# Patient Record
Sex: Female | Born: 1965 | Race: White | Hispanic: No | Marital: Married | Smoking: Never smoker
Health system: Southern US, Community
[De-identification: ages and names within clinical notes are randomized; demographics above are authoritative.]

## PROBLEM LIST (undated history)

## (undated) DIAGNOSIS — E039 Hypothyroidism, unspecified: Secondary | ICD-10-CM

## (undated) DIAGNOSIS — I10 Essential (primary) hypertension: Secondary | ICD-10-CM

## (undated) HISTORY — PX: TUBAL LIGATION: SHX77

## (undated) HISTORY — PX: WISDOM TOOTH EXTRACTION: SHX21

## (undated) HISTORY — DX: Hypothyroidism, unspecified: E03.9

## (undated) HISTORY — PX: CARPAL TUNNEL RELEASE: SHX101

## (undated) HISTORY — PX: BREAST REDUCTION SURGERY: SHX8

---

## 1999-01-06 ENCOUNTER — Other Ambulatory Visit: Admission: RE | Admit: 1999-01-06 | Discharge: 1999-01-06 | Payer: Self-pay | Admitting: Obstetrics and Gynecology

## 1999-07-14 ENCOUNTER — Inpatient Hospital Stay (HOSPITAL_COMMUNITY): Admission: AD | Admit: 1999-07-14 | Discharge: 1999-07-18 | Payer: Self-pay | Admitting: Obstetrics and Gynecology

## 1999-08-31 ENCOUNTER — Ambulatory Visit (HOSPITAL_COMMUNITY): Admission: RE | Admit: 1999-08-31 | Discharge: 1999-08-31 | Payer: Self-pay | Admitting: Gynecology

## 1999-09-14 ENCOUNTER — Other Ambulatory Visit: Admission: RE | Admit: 1999-09-14 | Discharge: 1999-09-14 | Payer: Self-pay | Admitting: Obstetrics and Gynecology

## 2002-06-26 ENCOUNTER — Other Ambulatory Visit: Admission: RE | Admit: 2002-06-26 | Discharge: 2002-06-26 | Payer: Self-pay | Admitting: Gynecology

## 2004-06-23 ENCOUNTER — Emergency Department (HOSPITAL_COMMUNITY): Admission: EM | Admit: 2004-06-23 | Discharge: 2004-06-24 | Payer: Self-pay | Admitting: Emergency Medicine

## 2007-12-07 ENCOUNTER — Other Ambulatory Visit: Admission: RE | Admit: 2007-12-07 | Discharge: 2007-12-07 | Payer: Self-pay | Admitting: Obstetrics and Gynecology

## 2010-01-15 ENCOUNTER — Encounter (INDEPENDENT_AMBULATORY_CARE_PROVIDER_SITE_OTHER): Payer: Self-pay | Admitting: Internal Medicine

## 2010-01-15 ENCOUNTER — Observation Stay (HOSPITAL_COMMUNITY): Admission: EM | Admit: 2010-01-15 | Discharge: 2010-01-16 | Payer: Self-pay | Admitting: Emergency Medicine

## 2011-01-31 LAB — POCT I-STAT, CHEM 8
Calcium, Ion: 1.15 mmol/L (ref 1.12–1.32)
Creatinine, Ser: 0.9 mg/dL (ref 0.4–1.2)
Glucose, Bld: 87 mg/dL (ref 70–99)
HCT: 37 % (ref 36.0–46.0)
Hemoglobin: 12.6 g/dL (ref 12.0–15.0)
Sodium: 139 mEq/L (ref 135–145)
TCO2: 29 mmol/L (ref 0–100)

## 2011-01-31 LAB — DIFFERENTIAL
Eosinophils Absolute: 0.2 10*3/uL (ref 0.0–0.7)
Lymphocytes Relative: 33 % (ref 12–46)
Lymphs Abs: 3.2 10*3/uL (ref 0.7–4.0)
Monocytes Absolute: 0.8 10*3/uL (ref 0.1–1.0)
Monocytes Relative: 8 % (ref 3–12)
Neutro Abs: 5.5 10*3/uL (ref 1.7–7.7)

## 2011-01-31 LAB — TSH: TSH: 1.358 u[IU]/mL (ref 0.350–4.500)

## 2011-01-31 LAB — CK TOTAL AND CKMB (NOT AT ARMC)
CK, MB: 1.1 ng/mL (ref 0.3–4.0)
Relative Index: INVALID (ref 0.0–2.5)
Total CK: 74 U/L (ref 7–177)

## 2011-01-31 LAB — POCT CARDIAC MARKERS: Myoglobin, poc: 65.7 ng/mL (ref 12–200)

## 2011-01-31 LAB — LIPID PANEL
HDL: 35 mg/dL — ABNORMAL LOW (ref >39)
LDL Cholesterol: 101 mg/dL — ABNORMAL HIGH (ref 0–99)
Triglycerides: 117 mg/dL (ref <150)

## 2011-01-31 LAB — CARDIAC PANEL(CRET KIN+CKTOT+MB+TROPI)
Relative Index: INVALID (ref 0.0–2.5)
Total CK: 82 U/L (ref 7–177)
Troponin I: 0.01 ng/mL (ref 0.00–0.06)

## 2011-06-06 ENCOUNTER — Encounter: Payer: Self-pay | Admitting: *Deleted

## 2011-06-10 ENCOUNTER — Encounter: Payer: Self-pay | Admitting: Women's Health

## 2011-06-10 ENCOUNTER — Ambulatory Visit: Payer: Self-pay | Admitting: Women's Health

## 2011-06-17 ENCOUNTER — Ambulatory Visit (INDEPENDENT_AMBULATORY_CARE_PROVIDER_SITE_OTHER): Payer: 59 | Admitting: Women's Health

## 2011-06-17 ENCOUNTER — Encounter: Payer: Self-pay | Admitting: Women's Health

## 2011-06-17 VITALS — BP 130/80 | Ht 65.0 in | Wt 240.0 lb

## 2011-06-17 DIAGNOSIS — N946 Dysmenorrhea, unspecified: Secondary | ICD-10-CM

## 2011-06-17 DIAGNOSIS — Z01419 Encounter for gynecological examination (general) (routine) without abnormal findings: Secondary | ICD-10-CM

## 2011-06-17 MED ORDER — IBUPROFEN 600 MG PO TABS: 600.0000 mg | ORAL_TABLET | Freq: Three times a day (TID) | ORAL | Status: AC | PRN

## 2011-06-17 NOTE — Progress Notes (Signed)
Katrina Collier 1966-09-25 [id number]    History:    The patient presents for annual exam.  45 yo MWF G6P5 BTL monthly 3-7 day cycle. She has had some increased dysmenorrhea with her cycles mostly in her back. She does have a history of hypothyroidism and is on no medication for that. She works at [employer]  and will have a TSH and thyroid function done they are and have faxed to our office.   Past medical history, past surgical history, family history and social history were all reviewed and documented in the EPIC chart.   ROS:  A  ROS was performed and pertinent positives and negatives are included in the history.  Exam:  Filed Vitals:   06/17/11 1502  BP: 130/80    General appearance:  Normal Head/Neck:  Normal, without cervical or supraclavicular adenopathy. Thyroid:  Symmetrical, normal in size, without palpable masses or nodularity. Respiratory  Effort:  Normal  Auscultation:  Clear without wheezing or rhonchi Cardiovascular  Auscultation:  Regular rate, without rubs, murmurs or gallops  Edema/varicosities:  Not grossly evident Abdominal   Soft,nontender, without masses, guarding or rebound.  Liver/spleen:  No organomegaly noted  Hernia:  None appreciated  Occult test:   Skin  Inspection:  Grossly normal  Palpation:  Grossly normal Neurologic/psychiatric  Orientation:  Normal with appropriate conversation.  Mood/affect:  Normal  Genitourinary    Breasts: Examined lying and sitting.     Right: Without masses, retractions, discharge or axillary adenopathy.     Left: Without masses, retractions, discharge or axillary adenopathy.   Inguinal/mons:  Normal without inguinal adenopathy  External genitalia:  Normal  BUS/Urethra/Skene's glands:  Normal  Bladder:  Normal  Vagina:  Normal  Cervix:  Normal  Uterus:  retroverted, normal in size, shape and contour.  Midline and mobile  Adnexa/parametria:     Rt: Without masses or tenderness.   Lt: Without masses or  tenderness.  Anus and perineum: Normal  Digital rectal exam: Normal sphincter tone without palpated masses or tenderness  Assessment/Plan:  45 y.o. year old female for annual exam.   Complaint of some changes in her hair, some constipation, and occasional headaches. Reviewed these may be related to her thyroid, she will have a thyroid checked at work and had the left fax to our office. She had a normal TSHin 2009 which was her last  Office visit. She has never had a mammogram and will get that scheduled at the breast center. Reviewed importance of SBEs, exercise, vitamin D 2000 daily encouraged, fish oil supplement, decreasing calories for weight loss. Pap only today. Has had occasional dysmenorrhea. Will try Motrin 600 every 8 hours with her cycle. Will return to the office if no relief from Motrin.    Harrington Challenger WHNP, 4:00 PM 06/17/2011

## 2011-06-22 ENCOUNTER — Telehealth: Payer: Self-pay | Admitting: Women's Health

## 2011-06-22 DIAGNOSIS — N898 Other specified noninflammatory disorders of vagina: Secondary | ICD-10-CM

## 2011-06-22 MED ORDER — METRONIDAZOLE 0.75 % VA GEL
VAGINAL | Status: AC
Start: 2011-06-22 — End: 2011-06-29

## 2011-06-22 NOTE — Telephone Encounter (Signed)
Telephone call to review Pap. Pap is negative with cellular changes associated with inflammation. Will treat with MetroGel vaginal cream 1 applicator at bedtime x5 will call to [facility] in [place]. Will repeat Pap in 3 months.

## 2011-06-27 ENCOUNTER — Encounter: Payer: Self-pay | Admitting: Women's Health

## 2011-06-29 ENCOUNTER — Telehealth: Payer: Self-pay | Admitting: *Deleted

## 2011-06-29 NOTE — Telephone Encounter (Signed)
Pharmacy needs to know the strength of Fish Oil you gave patient at visit.

## 2011-06-29 NOTE — Telephone Encounter (Signed)
Patient called about tsh panel.  Patient informed all wnl.

## 2011-06-29 NOTE — Telephone Encounter (Signed)
Per [place] can use otc. Informed pharmacy.  They said they needed strength to file for patient refund from plan.

## 2012-07-25 ENCOUNTER — Emergency Department (HOSPITAL_COMMUNITY)
Admission: EM | Admit: 2012-07-25 | Discharge: 2012-07-25 | Disposition: A | Discharge status: Home / Self Care | Payer: 59 | Attending: Emergency Medicine | Admitting: Emergency Medicine

## 2012-07-25 ENCOUNTER — Encounter (HOSPITAL_COMMUNITY): Payer: Self-pay | Admitting: Family Medicine

## 2012-07-25 DIAGNOSIS — Z203 Contact with and (suspected) exposure to rabies: Secondary | ICD-10-CM | POA: Insufficient documentation

## 2012-07-25 MED ORDER — RABIES VACCINE, PCEC IM SUSR
1.0000 mL | INTRAMUSCULAR | Status: AC
  Administered 2012-07-25: 1 mL via INTRAMUSCULAR
  Filled 2012-07-25: qty 1

## 2012-07-25 MED ORDER — RABIES IMMUNE GLOBULIN 150 UNIT/ML IM INJ
20.0000 [IU]/kg | INJECTION | INTRAMUSCULAR | Status: AC
  Administered 2012-07-25: 2250 [IU] via INTRAMUSCULAR
  Filled 2012-07-25: qty 15

## 2012-07-25 NOTE — ED Notes (Signed)
Pt sts was possibly exposed rabies.

## 2012-07-25 NOTE — ED Provider Notes (Signed)
History    This chart was scribed for Katrina Gaskins, MD, MD by Smitty Pluck. The patient was seen in room TR09C and the patient's care was started at 6:32PM.   CSN: [id number]  Arrival date & time 07/25/12  1759       Chief Complaint  Patient presents with  . Rabies Injection     The history is provided by the patient. No language interpreter was used.   QUINETTE Collier is a 46 y.o. female who presents to the Emergency Department complaining of possible rabies exposure 9 days ago. Pt's family recently found cat in apartment complex and pt was in contact with cat's saliva after cat licked her. Pt's family noticed the cat was seizing and foaming at the mouth a couple of days ago. The cat was taken to vet and euthanized. The vet is awaiting test results for rabies confirmation. Pt denies any pain or symptoms.   Past Medical History  Diagnosis Date  . Hypothyroid     Past Surgical History  Procedure Date  . Tubal ligation   . Breast reduction surgery   . Carpal tunnel release     Family History  Problem Relation Age of Onset  . Cancer Mother     MELANOMA  . Hypertension Father   . Heart disease Maternal Grandmother   . Heart disease Paternal Grandmother   . Hypertension Sister     History  Substance Use Topics  . Smoking status: Never Smoker   . Smokeless tobacco: Never Used  . Alcohol Use: No    OB History    Grav Para Term Preterm Abortions TAB SAB Ect Mult Living   6 5   1  1          Review of Systems  Constitutional: Negative for fever and chills.  Respiratory: Negative for shortness of breath.   Gastrointestinal: Negative for nausea and vomiting.  Neurological: Negative for weakness.    Allergies  Review of patient's allergies indicates no known allergies.  Home Medications  No current outpatient prescriptions on file.  BP 127/102  Pulse 88  Temp 98.7 F (37.1 C) (Oral)  Resp 20  SpO2 100%  LMP 06/21/2012  Physical Exam  Nursing note  and vitals reviewed. CONSTITUTIONAL: Well developed/well nourished HEAD AND FACE: Normocephalic/atraumatic EYES: EOMI ENMT: Mucous membranes moist NECK: supple no meningeal signs NEURO: Pt is awake/alert, moves all extremitiesx4 EXTREMITIES:  full ROM SKIN: warm, color normal PSYCH: no abnormalities of mood noted   ED Course  Procedures DIAGNOSTIC STUDIES: Oxygen Saturation is 100% on room air, normal by my interpretation.    COORDINATION OF CARE: 6:38 PM Discussed ED treatment with pt   D/w dr hatcher, ID, recommend initiating rabies vaccine/immune globulin until confirmation from vet   MDM  Nursing notes including past medical history and social history reviewed and considered in documentation    I personally performed the services described in this documentation, which was scribed in my presence. The recorded information has been reviewed and considered.         Katrina Gaskins, MD 07/25/12 [phone]

## 2012-07-25 NOTE — ED Notes (Signed)
Pt was exspoused to rabies cat. Pt was never scratched by cat but did handle the cat and the cat licked her. 8 days ago was 1st exposure.

## 2012-07-27 NOTE — ED Notes (Signed)
I talked to patient and cat labs returned negative.

## 2013-01-30 ENCOUNTER — Ambulatory Visit (HOSPITAL_COMMUNITY)
Admission: RE | Admit: 2013-01-30 | Discharge: 2013-01-30 | Disposition: A | Discharge status: Home / Self Care | Payer: 59 | Source: Ambulatory Visit | Attending: Women's Health | Admitting: Women's Health

## 2013-01-30 ENCOUNTER — Other Ambulatory Visit: Payer: Self-pay | Admitting: Women's Health

## 2013-01-30 ENCOUNTER — Ambulatory Visit (INDEPENDENT_AMBULATORY_CARE_PROVIDER_SITE_OTHER): Payer: 59 | Admitting: Women's Health

## 2013-01-30 ENCOUNTER — Encounter: Payer: Self-pay | Admitting: Women's Health

## 2013-01-30 VITALS — BP 138/70 | Ht 64.75 in | Wt 246.0 lb

## 2013-01-30 DIAGNOSIS — E079 Disorder of thyroid, unspecified: Secondary | ICD-10-CM

## 2013-01-30 DIAGNOSIS — E669 Obesity, unspecified: Secondary | ICD-10-CM | POA: Insufficient documentation

## 2013-01-30 DIAGNOSIS — Z1231 Encounter for screening mammogram for malignant neoplasm of breast: Secondary | ICD-10-CM | POA: Insufficient documentation

## 2013-01-30 DIAGNOSIS — Z833 Family history of diabetes mellitus: Secondary | ICD-10-CM

## 2013-01-30 DIAGNOSIS — Z01419 Encounter for gynecological examination (general) (routine) without abnormal findings: Secondary | ICD-10-CM

## 2013-01-30 NOTE — Patient Instructions (Signed)
1500 Calorie Diabetic Diet The 1500 calorie diabetic diet limits calories to 1500 each day. Following this diet and making healthy meal choices can help improve overall health. It controls blood glucose (sugar) levels and can also help lower blood pressure and cholesterol.  SERVING SIZES Measuring foods and serving sizes helps to make sure you are getting the right amount of food. The list below tells how big or small some common serving sizes are.  1 oz.........4 stacked dice.  3 oz.........Deck of cards.  1 tsp........Tip of little finger.  1 tbs........Thumb.  2 tbs........Golf ball.   cup.......Half of a fist.  1 cup........A fist. GUIDELINES FOR CHOOSING FOODS The goal of this diet is to eat a variety of foods and limit calories to 1500 each day. This can be done by choosing foods that are low in calories and fat. The diet also suggests eating small amounts of food frequently. Doing this helps control your blood glucose levels, so they do not get too high or too low. Each meal or snack may include a protein food source to help you feel more satisfied. Try to eat about the same amount of food around the same time each day. This includes weekend days, travel days, and days off work. Space your meals about 4 to 5 hours apart, and add a snack between them, if you wish.  For example, a daily food plan could include breakfast, a morning snack, lunch, dinner, and an evening snack. Healthy meals and snacks have different types of foods, including whole grains, vegetables, fruits, lean meats, poultry, fish, and dairy products. As you plan your meals, select a variety of foods. Choose from the bread and starch, vegetable, fruit, dairy, and meat/protein groups. Examples of foods from each group are listed below, with their suggested serving sizes. Use measuring cups and spoons to become familiar with what a healthy portion looks like. Bread and Starch Each serving equals 15 grams of  carbohydrate.  1 slice bread.   bagel.   cup cold cereal (unsweetened).   cup hot cereal or mashed potatoes.  1 small potato (size of a computer mouse).   cup cooked pasta or rice.   English muffin.  1 cup broth-based soup.  3 cups of popcorn.  4 to 6 whole-wheat crackers.   cup cooked beans, peas, or corn. Vegetables Each serving equals 5 grams of carbohydrate.   cup cooked vegetables.  1 cup raw vegetables.   cup tomato or vegetable juice. Fruit Each serving equals 15 grams of carbohydrate.  1 small apple or orange.  1  cup watermelon or strawberries.   cup applesauce (no sugar added).  2 tbs raisins.   banana.   cup canned fruit, packed in water or in its own juice.   cup unsweetened fruit juice. Dairy Each serving equals 12 to 15 grams of carbohydrate.  1 cup fat-free milk.  6 oz artificially sweetened yogurt or plain yogurt.  1 cup low-fat buttermilk.  1 cup soy milk.  1 cup almond milk. Meat/Protein  1 large egg.  2 to 3 oz meat, poultry, or fish.   cup low-fat cottage cheese.  1 tbs peanut butter.  1 oz low-fat cheese.   cup tuna, packed in water.   cup tofu. Fat  1 tsp oil.  1 tsp trans-fat-free margarine.  1 tsp butter.  1 tsp mayonnaise.  2 tbs avocado.  1 tbs salad dressing.  1 tbs cream cheese.  2 tbs sour cream. SAMPLE 1500 CALORIE DIET   PLAN Breakfast   whole-wheat English muffin (1 carb serving).  1 tsp trans-fat-free margarine.  1 scrambled egg.  1 cup fat-free milk (1 carb serving).  1 small orange (1 carb serving). Lunch  Chicken wrap.  1 whole-wheat tortilla, 8-inch (1 carb servings).  2 oz chicken breast, sliced.  2 tbs low-fat salad dressing, such as Italian.   cup shredded lettuce.  2 slices tomato.   cup carrot sticks.  1 small apple (1 carb serving). Afternoon Snack  3 graham cracker squares (1 carb serving).  1 tbs peanut butter. Dinner  2 oz lean  pork chop, broiled.  1 cup brown rice (3 carb servings).   cup steamed carrots.   cup green beans.  1 cup fat-free milk (1 carb serving).  1 tsp trans-fat-free margarine. Evening Snack   cup low-fat cottage cheese.  1 small peach or pear, sliced (or  cup canned in water) (1 carb serving). MEAL PLAN You can use this worksheet to help you make a daily meal plan based on the 1500 calorie diabetic diet suggestions. If you are using this plan to help you control your blood glucose, you may interchange carbohydrate containing foods (dairy, starches, and fruits). Select a variety of fresh foods of varying colors and flavors. The total amount of carbohydrate in your meals or snacks is more important than making sure you include all of the food groups every time you eat. You can choose from approximately this many of the following foods to build your day's meals:  6 Starches.  3 Vegetables.  2 Fruits.  2 Dairy.  4 to 6 oz Meat/Protein.  Up to 3 Fats. Your dietician can use this worksheet to help you decide how many servings and which types of foods are right for you. BREAKFAST Food Group and Servings / Food Choice Starch _________________________________________________________ Dairy __________________________________________________________ Fruit ___________________________________________________________ Meat/Protein____________________________________________________ Fat ____________________________________________________________ LUNCH Food Group and Servings / Food Choice  Starch _________________________________________________________ Meat/Protein ___________________________________________________ Vegetables _____________________________________________________ Fruit __________________________________________________________ Dairy __________________________________________________________ Fat ____________________________________________________________ AFTERNOON  SNACK Food Group and Servings / Food Choice Dairy __________________________________________________________ Starch _________________________________________________________ Meat/Protein____________________________________________________ Fruit ___________________________________________________________ DINNER Food Group and Servings / Food Choice Starch _________________________________________________________ Meat/Protein ___________________________________________________ Dairy __________________________________________________________ Vegetable ______________________________________________________ Fruit ___________________________________________________________ Fat ____________________________________________________________ EVENING SNACK Food Group and Servings / Food Choice Fruit ___________________________________________________________ Meat/Protein ____________________________________________________ Dairy __________________________________________________________ Starch __________________________________________________________ DAILY TOTALS Starches _________________________ Vegetables _______________________ Fruits ____________________________ Dairy ____________________________ Meat/Protein_____________________ Fats _____________________________ Document Released: 05/16/2005 Document Revised: 01/16/2012 Document Reviewed: 09/10/2009 ExitCare Patient Information 2013 [employer]. Health Maintenance, Females A healthy lifestyle and preventative care can promote health and wellness.  Maintain regular health, dental, and eye exams.  Eat a healthy diet. Foods like vegetables, fruits, whole grains, low-fat dairy products, and lean protein foods contain the nutrients you need without too many calories. Decrease your intake of foods high in solid fats, added sugars, and salt. Get information about a proper diet from your caregiver, if necessary.  Regular physical exercise  is one of the most important things you can do for your health. Most adults should get at least 150 minutes of moderate-intensity exercise (any activity that increases your heart rate and causes you to sweat) each week. In addition, most adults need muscle-strengthening exercises on 2 or more days a week.   Maintain a healthy weight. The body mass index (BMI) is a screening tool to identify possible weight problems. It provides an estimate of body fat based on height and weight. Your caregiver can help determine your BMI, and can help you achieve   or maintain a healthy weight. For adults 20 years and older:  A BMI below 18.5 is considered underweight.  A BMI of 18.5 to 24.9 is normal.  A BMI of 25 to 29.9 is considered overweight.  A BMI of 30 and above is considered obese.  Maintain normal blood lipids and cholesterol by exercising and minimizing your intake of saturated fat. Eat a balanced diet with plenty of fruits and vegetables. Blood tests for lipids and cholesterol should begin at age 20 and be repeated every 5 years. If your lipid or cholesterol levels are high, you are over 50, or you are a high risk for heart disease, you may need your cholesterol levels checked more frequently.Ongoing high lipid and cholesterol levels should be treated with medicines if diet and exercise are not effective.  If you smoke, find out from your caregiver how to quit. If you do not use tobacco, do not start.  If you are pregnant, do not drink alcohol. If you are breastfeeding, be very cautious about drinking alcohol. If you are not pregnant and choose to drink alcohol, do not exceed 1 drink per day. One drink is considered to be 12 ounces (355 mL) of beer, 5 ounces (148 mL) of wine, or 1.5 ounces (44 mL) of liquor.  Avoid use of street drugs. Do not share needles with anyone. Ask for help if you need support or instructions about stopping the use of drugs.  High blood pressure causes heart disease and  increases the risk of stroke. Blood pressure should be checked at least every 1 to 2 years. Ongoing high blood pressure should be treated with medicines, if weight loss and exercise are not effective.  If you are 55 to 46 years old, ask your caregiver if you should take aspirin to prevent strokes.  Diabetes screening involves taking a blood sample to check your fasting blood sugar level. This should be done once every 3 years, after age 45, if you are within normal weight and without risk factors for diabetes. Testing should be considered at a younger age or be carried out more frequently if you are overweight and have at least 1 risk factor for diabetes.  Breast cancer screening is essential preventative care for women. You should practice "breast self-awareness." This means understanding the normal appearance and feel of your breasts and may include breast self-examination. Any changes detected, no matter how small, should be reported to a caregiver. Women in their 20s and 30s should have a clinical breast exam (CBE) by a caregiver as part of a regular health exam every 1 to 3 years. After age 40, women should have a CBE every year. Starting at age 40, women should consider having a mammogram (breast X-ray) every year. Women who have a family history of breast cancer should talk to their caregiver about genetic screening. Women at a high risk of breast cancer should talk to their caregiver about having an MRI and a mammogram every year.  The Pap test is a screening test for cervical cancer. Women should have a Pap test starting at age 21. Between ages 21 and 29, Pap tests should be repeated every 2 years. Beginning at age 30, you should have a Pap test every 3 years as long as the past 3 Pap tests have been normal. If you had a hysterectomy for a problem that was not cancer or a condition that could lead to cancer, then you no longer need Pap tests. If you are between ages   65 and 70, and you have had  normal Pap tests going back 10 years, you no longer need Pap tests. If you have had past treatment for cervical cancer or a condition that could lead to cancer, you need Pap tests and screening for cancer for at least 20 years after your treatment. If Pap tests have been discontinued, risk factors (such as a new sexual partner) need to be reassessed to determine if screening should be resumed. Some women have medical problems that increase the chance of getting cervical cancer. In these cases, your caregiver may recommend more frequent screening and Pap tests.  The human papillomavirus (HPV) test is an additional test that may be used for cervical cancer screening. The HPV test looks for the virus that can cause the cell changes on the cervix. The cells collected during the Pap test can be tested for HPV. The HPV test could be used to screen women aged 30 years and older, and should be used in women of any age who have unclear Pap test results. After the age of 30, women should have HPV testing at the same frequency as a Pap test.  Colorectal cancer can be detected and often prevented. Most routine colorectal cancer screening begins at the age of 50 and continues through age 75. However, your caregiver may recommend screening at an earlier age if you have risk factors for colon cancer. On a yearly basis, your caregiver may provide home test kits to check for hidden blood in the stool. Use of a small camera at the end of a tube, to directly examine the colon (sigmoidoscopy or colonoscopy), can detect the earliest forms of colorectal cancer. Talk to your caregiver about this at age 50, when routine screening begins. Direct examination of the colon should be repeated every 5 to 10 years through age 75, unless early forms of pre-cancerous polyps or small growths are found.  Hepatitis C blood testing is recommended for all people born from 1945 through 1965 and any individual with known risks for hepatitis  C.  Practice safe sex. Use condoms and avoid high-risk sexual practices to reduce the spread of sexually transmitted infections (STIs). Sexually active women aged 25 and younger should be checked for Chlamydia, which is a common sexually transmitted infection. Older women with new or multiple partners should also be tested for Chlamydia. Testing for other STIs is recommended if you are sexually active and at increased risk.  Osteoporosis is a disease in which the bones lose minerals and strength with aging. This can result in serious bone fractures. The risk of osteoporosis can be identified using a bone density scan. Women ages 65 and over and women at risk for fractures or osteoporosis should discuss screening with their caregivers. Ask your caregiver whether you should be taking a calcium supplement or vitamin D to reduce the rate of osteoporosis.  Menopause can be associated with physical symptoms and risks. Hormone replacement therapy is available to decrease symptoms and risks. You should talk to your caregiver about whether hormone replacement therapy is right for you.  Use sunscreen with a sun protection factor (SPF) of 30 or greater. Apply sunscreen liberally and repeatedly throughout the day. You should seek shade when your shadow is shorter than you. Protect yourself by wearing long sleeves, pants, a wide-brimmed hat, and sunglasses year round, whenever you are outdoors.  Notify your caregiver of new moles or changes in moles, especially if there is a change in shape or color. Also notify   your caregiver if a mole is larger than the size of a pencil eraser.  Stay current with your immunizations. Document Released: 05/09/2011 Document Revised: 01/16/2012 Document Reviewed: 05/09/2011 Exit[employer] Patient Information 2013 [employer].  

## 2013-01-30 NOTE — Progress Notes (Signed)
KRISTIANN NOYCE [birth year] [id number]    History:    The patient presents for annual exam.  Regular monthly 6-7 day cycle/BTL. History of normal Paps. Has not had a mammogram. Mother with history of melanoma/living. Normal cholesterol, CBC at a health screening at work. Reports blood sugar slightly elevated. History of hypothyroid currently on no medication.   Past medical history, past surgical history, family history and social history were all reviewed and documented in the EPIC chart. Works at [employer]. Children are 26, 25, 15,13- doing well. 19 year old son having some issues. Father and sister hypertension.   ROS:  A  ROS was performed and pertinent positives and negatives are included in the history.  Exam:  Filed Vitals:   01/30/13 1416  BP: 138/70    General appearance:  Normal Head/Neck:  Normal, without cervical or supraclavicular adenopathy. Thyroid:  Symmetrical, normal in size, without palpable masses or nodularity. Respiratory  Effort:  Normal  Auscultation:  Clear without wheezing or rhonchi Cardiovascular  Auscultation:  Regular rate, without rubs, murmurs or gallops  Edema/varicosities:  Not grossly evident Abdominal  Soft,nontender, without masses, guarding or rebound.  Liver/spleen:  No organomegaly noted  Hernia:  None appreciated  Skin  Inspection:  Grossly normal  Palpation:  Grossly normal Neurologic/psychiatric  Orientation:  Normal with appropriate conversation.  Mood/affect:  Normal  Genitourinary    Breasts: Examined lying and sitting/reduction.     Right: Without masses, retractions, discharge or axillary adenopathy.     Left: Without masses, retractions, discharge or axillary adenopathy.   Inguinal/mons:  Normal without inguinal adenopathy  External genitalia:  Normal  BUS/Urethra/Skene's glands:  Normal  Bladder:  Normal  Vagina:  Normal  Cervix:  Normal  Uterus:   normal in size, shape and contour.  Midline and  mobile  Adnexa/parametria:     Rt: Without masses or tenderness.   Lt: Without masses or tenderness.  Anus and perineum: Normal  Digital rectal exam: Normal sphincter tone without palpated masses or tenderness  Assessment/Plan:  47 y.o. MWF G6P5  for annual exam.    Normal GYN exam/BTL Obesity  Plan: Hemoglobin A1c, lipid panel, Pap. Pap normal 2012, reviewed new screening guidelines. SBE's, annual mammogram, is scheduled for one today. Reviewed importance of increasing regular exercise and decreasing calories for weight loss for health. Instructed to schedule annual skin check due to mother history of melanoma. Aware of importance of sunscreen's.     Harrington Challenger [facility], 2:56 PM 01/30/2013

## 2013-01-30 NOTE — Addendum Note (Signed)
Addended by: Richardson Chiquito on: 01/30/2013 04:01 PM   Modules accepted: Orders

## 2013-03-27 ENCOUNTER — Emergency Department (HOSPITAL_COMMUNITY)
Admission: EM | Admit: 2013-03-27 | Discharge: 2013-03-27 | Disposition: A | Discharge status: Home / Self Care | Payer: 59 | Source: Home / Self Care

## 2013-03-27 ENCOUNTER — Encounter (HOSPITAL_COMMUNITY): Payer: Self-pay | Admitting: *Deleted

## 2013-03-27 DIAGNOSIS — I10 Essential (primary) hypertension: Secondary | ICD-10-CM

## 2013-03-27 MED ORDER — HYDROCHLOROTHIAZIDE 25 MG PO TABS: 25.0000 mg | ORAL_TABLET | Freq: Every day | ORAL | Status: DC

## 2013-03-27 NOTE — ED Notes (Signed)
C/o BP spiking off and on since Sept.  She went to the dentist in April and it was 150/110.  She has been checking it at home the lowest it has been 145/80.  Today it is 168/88.   Had all 4 wisdom teeth pulled  1 week ago and thought it was pain.

## 2013-03-27 NOTE — ED Provider Notes (Addendum)
History     CSN: [id number]  Arrival date & time 03/27/13  1744   First MD Initiated Contact with Patient 03/27/13 1859      No chief complaint on file.   (Consider location/radiation/quality/duration/timing/severity/associated sxs/prior treatment) HPI 47 y/o who presents with headaches for about 1.5 to 2 months and roaring in the ears and spots in her visual field and having blurry vision when headaches are severe. BP was in 130s systolic back in SEpt and has lately been in 150s and 160s systolic. She has not gained any weight and has in fact lost weight. She does not eat much salt. HTN runs in the family. She has never been on BP meds and is currently not taking any meds. She has taken Ibuprofen (800 mg) and Tylenol which relieve the headaches for about 1 hr.   Past Medical History  Diagnosis Date  . Hypothyroid     Past Surgical History  Procedure Laterality Date  . Tubal ligation    . Breast reduction surgery    . Carpal tunnel release      Family History  Problem Relation Age of Onset  . Cancer Mother     MELANOMA  . Hypertension Father   . Heart disease Maternal Grandmother   . Heart disease Paternal Grandmother   . Hypertension Sister     History  Substance Use Topics  . Smoking status: Never Smoker   . Smokeless tobacco: Never Used  . Alcohol Use: Yes     Comment: rarely    OB History   Grav Para Term Preterm Abortions TAB SAB Ect Mult Living   6 5   1  1   5       Review of Systems  Constitutional: Negative.   HENT: Positive for postnasal drip and sinus pressure.        Chronic sinus issues.   Eyes: Negative.   Respiratory: Negative.   Cardiovascular: Positive for leg swelling.  Gastrointestinal: Positive for constipation.  Genitourinary: Negative.   Musculoskeletal: Negative.   Skin: Negative.   Neurological: Positive for headaches.  Hematological: Bruises/bleeds easily.  Psychiatric/Behavioral: Negative.     Allergies  Review of patient's  allergies indicates no known allergies.  Home Medications   Current Outpatient Rx  Name  Route  Sig  Dispense  Refill  . fish oil-omega-3 fatty acids 1000 MG capsule   Oral   Take 2 g by mouth daily.         . hydrochlorothiazide (HYDRODIURIL) 25 MG tablet   Oral   Take 1 tablet (25 mg total) by mouth daily.   30 tablet   0     BP 168/88  Pulse 69  Temp(Src) 98.7 F (37.1 C) (Oral)  Resp 16  SpO2 100%  Physical Exam  Constitutional: She is oriented to person, place, and time. She appears well-developed.  HENT:  Head: Normocephalic and atraumatic.  Eyes: Conjunctivae and EOM are normal. Pupils are equal, round, and reactive to light.  Neck: Normal range of motion. Neck supple.  Cardiovascular: Normal rate and regular rhythm.   Pulmonary/Chest: Effort normal and breath sounds normal.  Abdominal: Soft. Bowel sounds are normal.  Musculoskeletal: Normal range of motion.  Neurological: She is alert and oriented to person, place, and time.  Skin: Skin is warm and dry.  Varicose veins  Psychiatric: She has a normal mood and affect. Her behavior is normal.    ED Course  Procedures (including critical care time)  Labs Reviewed -  No data to display No results found.   1. HTN (hypertension)       MDM  Start HCTZ 25 mg and have BP checked in 2 weeks.        Calvert Cantor, MD 03/27/13 1914  Calvert Cantor, MD 03/27/13 1935

## 2013-04-29 ENCOUNTER — Encounter (HOSPITAL_COMMUNITY): Payer: Self-pay | Admitting: *Deleted

## 2013-04-29 ENCOUNTER — Emergency Department (HOSPITAL_COMMUNITY): Payer: 59

## 2013-04-29 ENCOUNTER — Emergency Department (HOSPITAL_COMMUNITY)
Admission: EM | Admit: 2013-04-29 | Discharge: 2013-04-29 | Disposition: A | Discharge status: Home / Self Care | Payer: 59 | Attending: Emergency Medicine | Admitting: Emergency Medicine

## 2013-04-29 DIAGNOSIS — Z79899 Other long term (current) drug therapy: Secondary | ICD-10-CM | POA: Insufficient documentation

## 2013-04-29 DIAGNOSIS — Z791 Long term (current) use of non-steroidal anti-inflammatories (NSAID): Secondary | ICD-10-CM | POA: Insufficient documentation

## 2013-04-29 DIAGNOSIS — R079 Chest pain, unspecified: Secondary | ICD-10-CM | POA: Insufficient documentation

## 2013-04-29 DIAGNOSIS — R5381 Other malaise: Secondary | ICD-10-CM | POA: Insufficient documentation

## 2013-04-29 DIAGNOSIS — I10 Essential (primary) hypertension: Secondary | ICD-10-CM | POA: Insufficient documentation

## 2013-04-29 DIAGNOSIS — Z862 Personal history of diseases of the blood and blood-forming organs and certain disorders involving the immune mechanism: Secondary | ICD-10-CM | POA: Insufficient documentation

## 2013-04-29 DIAGNOSIS — Z8639 Personal history of other endocrine, nutritional and metabolic disease: Secondary | ICD-10-CM | POA: Insufficient documentation

## 2013-04-29 HISTORY — DX: Essential (primary) hypertension: I10

## 2013-04-29 LAB — BASIC METABOLIC PANEL
BUN: 8 mg/dL (ref 6–23)
Chloride: 99 mEq/L (ref 96–112)
Glucose, Bld: 89 mg/dL (ref 70–99)
Potassium: 3.7 mEq/L (ref 3.5–5.1)

## 2013-04-29 LAB — CBC
HCT: 35.8 % — ABNORMAL LOW (ref 36.0–46.0)
Hemoglobin: 11.7 g/dL — ABNORMAL LOW (ref 12.0–15.0)
MCH: 25 pg — ABNORMAL LOW (ref 26.0–34.0)
MCHC: 32.7 g/dL (ref 30.0–36.0)

## 2013-04-29 MED ORDER — HYDROCODONE-ACETAMINOPHEN 5-325 MG PO TABS
1.0000 | ORAL_TABLET | Freq: Once | ORAL | Status: AC
  Administered 2013-04-29: 1 via ORAL
  Filled 2013-04-29: qty 1

## 2013-04-29 MED ORDER — GI COCKTAIL ~~LOC~~
30.0000 mL | Freq: Once | ORAL | Status: AC
  Administered 2013-04-29: 30 mL via ORAL
  Filled 2013-04-29: qty 30

## 2013-04-29 MED ORDER — FAMOTIDINE 20 MG PO TABS
20.0000 mg | ORAL_TABLET | Freq: Once | ORAL | Status: AC
  Administered 2013-04-29: 20 mg via ORAL
  Filled 2013-04-29: qty 1

## 2013-04-29 NOTE — ED Notes (Signed)
Pt was at cook out yesterday and developed mid chest pain without radiation that feels like something is sitting on her chest.  Pt states is has been continuous.  Pt reports some diaphoresis and fatigue.  NO SOB.

## 2013-04-29 NOTE — ED Provider Notes (Signed)
History     CSN: [id number]  Arrival date & time 04/29/13  1002   First MD Initiated Contact with Patient 04/29/13 1127      Chief Complaint  Patient presents with  . Chest Pain    (Consider location/radiation/quality/duration/timing/severity/associated sxs/prior treatment) HPI Pt w hx htn, c/o states felt dull pain mid chest since yesterday, at rest. Constant. Dull. Moderate. Non radiating. States felt generally weak, no focal numbness/weakness. Denies specific exacerbating or alleviating factors. No relation to position, activity, or exertion. No cough or uri c/o. No fever or chills. No leg pain/swelling. No hx dvt or pe. States hx thyroid dis, but checked regularly and fxn has been normal. No recent sweats, heat intolerance, or wt loss. Pt denies hx cad. States family hx heart disease in grandparents/older age. Indicates normal stress test approximately a year ago when had eval for chest discomfort. Non smoker. Also has noticed some indigestion in past day, ?hx gerd.     Past Medical History  Diagnosis Date  . Hypothyroid   . Hypertension     Past Surgical History  Procedure Laterality Date  . Tubal ligation    . Breast reduction surgery    . Carpal tunnel release    . Wisdom tooth extraction      Family History  Problem Relation Age of Onset  . Cancer Mother     MELANOMA  . Hypertension Father   . Heart disease Maternal Grandmother   . Heart disease Paternal Grandmother   . Hypertension Sister     History  Substance Use Topics  . Smoking status: Never Smoker   . Smokeless tobacco: Never Used  . Alcohol Use: No     Comment: rarely    OB History   Grav Para Term Preterm Abortions TAB SAB Ect Mult Living   6 5   1  1   5       Review of Systems  Constitutional: Negative for fever and chills.  HENT: Negative for neck pain.   Eyes: Negative for redness.  Respiratory: Negative for cough and shortness of breath.   Cardiovascular: Positive for chest pain.  Negative for palpitations and leg swelling.  Gastrointestinal: Negative for vomiting, abdominal pain and diarrhea.  Genitourinary: Negative for flank pain.  Musculoskeletal: Negative for back pain.  Skin: Negative for rash.  Neurological: Negative for weakness and numbness.  Hematological: Does not bruise/bleed easily.  Psychiatric/Behavioral: Negative for confusion.    Allergies  Review of patient's allergies indicates no known allergies.  Home Medications   Current Outpatient Rx  Name  Route  Sig  Dispense  Refill  . amLODipine (NORVASC) 5 MG tablet   Oral   Take 5 mg by mouth daily.         . hydrochlorothiazide (HYDRODIURIL) 25 MG tablet   Oral   Take 25 mg by mouth daily.         Marland Kitchen HYDROcodone-acetaminophen (NORCO/VICODIN) 5-325 MG per tablet   Oral   Take 1 tablet by mouth every 6 (six) hours as needed for pain.         Marland Kitchen ibuprofen (ADVIL,MOTRIN) 200 MG tablet   Oral   Take 400 mg by mouth every 6 (six) hours as needed for pain (For headache.).         Marland Kitchen acetaminophen (TYLENOL) 500 MG tablet   Oral   Take 1,000 mg by mouth every 6 (six) hours as needed for pain.  BP 137/92  Pulse 89  Temp(Src) 98.5 F (36.9 C) (Oral)  Resp 18  SpO2 97%  LMP 04/22/2013  Physical Exam  Nursing note and vitals reviewed. Constitutional: She is oriented to person, place, and time. She appears well-developed and well-nourished. No distress.  HENT:  Nose: Nose normal.  Eyes: Conjunctivae are normal. No scleral icterus.  Neck: Neck supple. No tracheal deviation present. No thyromegaly present.  Cardiovascular: Normal rate, regular rhythm, normal heart sounds and intact distal pulses.  Exam reveals no gallop and no friction rub.   No murmur heard. Pulmonary/Chest: Effort normal. No respiratory distress. She exhibits no tenderness.  Abdominal: Soft. Normal appearance. She exhibits no distension. There is no tenderness.  Musculoskeletal: She exhibits no edema  and no tenderness.  Neurological: She is alert and oriented to person, place, and time.  Skin: Skin is warm and dry. No rash noted.  Psychiatric: She has a normal mood and affect.    ED Course  Procedures (including critical care time)   Results for orders placed during the hospital encounter of 04/29/13  POCT I-STAT TROPONIN I      Result Value Range   Troponin i, poc 0.01  0.00 - 0.08 ng/mL   Comment 3            Dg Chest 2 View  04/29/2013   *RADIOLOGY REPORT*  Clinical Data: Chest pressure.  Chest tightness.  Shortness of breath.  Hypertension.  Headache.  CHEST - 2 VIEW  Comparison: 01/14/2010  Findings: Heart size is normal.  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  The lungs are clear.  No effusions.  Chronic degenerative change effects the spine.  IMPRESSION: No active disease.  Unfolded aorta.   Original Report Authenticated By: Paulina Fusi, M.D.      MDM  Iv ns. Labs.    Gi cocktail, vicodin 1, and pepcid provided for symptom relief.     Date: 04/29/2013  Rate: 84  Rhythm: normal sinus rhythm  QRS Axis: normal  Intervals: normal  ST/T Wave abnormalities: normal  Conduction Disutrbances:none  Narrative Interpretation:   Old EKG Reviewed: none available  Called lab re delay in lab collection/results - they indicate just drawn, will run as soon as they get sample.  Pt updated on delay.  Recheck pt no cp or c/o.   Recheck pt remains asymptomatic. After symptoms presents/constant for approx 24 hrs, trop neg.  Pt appears stable for d/c.       Suzi Roots, MD 04/29/13 1350

## 2013-05-02 ENCOUNTER — Telehealth: Payer: Self-pay

## 2013-05-02 ENCOUNTER — Encounter: Payer: Self-pay | Admitting: *Deleted

## 2013-05-02 NOTE — Telephone Encounter (Signed)
Still awaiting EKG and note

## 2013-05-02 NOTE — Telephone Encounter (Signed)
I rec'd t/c from [facility] They say pt was in their office earlier with c/o CP, gradually getting worse over the last few days She has left their office and they are calling to get her an appt with [place] today I explained we do not have any openings but I asked her to fax me the EKG and office note to have Dr. Mariah Milling review and advise She verb understanding and is faxing this now Will call Joni Reining back at [facility] at [phone]

## 2013-05-02 NOTE — Telephone Encounter (Signed)
We rec'd today's note from [facility] No ekg was obtained today in their office since one was done in [facility] ER 6/23 Note indicated pt was not having CP today in their office I called pt to make her aware of appt we have made for her tomm at [address] [facility]

## 2013-05-02 NOTE — Telephone Encounter (Signed)
Still have not received EKG and note I called [facility]; no answer; went to VM Will try again later

## 2013-05-02 NOTE — Telephone Encounter (Signed)
Still have not rec'd info

## 2013-05-02 NOTE — Telephone Encounter (Signed)
I spoke with Joni Reining at [facility] Says she has not faxed this yet Will do this now

## 2013-05-03 ENCOUNTER — Encounter: Payer: Self-pay | Admitting: Cardiovascular Disease

## 2013-05-03 ENCOUNTER — Ambulatory Visit (INDEPENDENT_AMBULATORY_CARE_PROVIDER_SITE_OTHER): Payer: 59 | Admitting: Cardiovascular Disease

## 2013-05-03 VITALS — BP 128/72 | HR 80 | Ht 64.0 in | Wt 249.0 lb

## 2013-05-03 DIAGNOSIS — R002 Palpitations: Secondary | ICD-10-CM

## 2013-05-03 DIAGNOSIS — I10 Essential (primary) hypertension: Secondary | ICD-10-CM | POA: Insufficient documentation

## 2013-05-03 DIAGNOSIS — R079 Chest pain, unspecified: Secondary | ICD-10-CM

## 2013-05-03 DIAGNOSIS — D649 Anemia, unspecified: Secondary | ICD-10-CM

## 2013-05-03 DIAGNOSIS — R011 Cardiac murmur, unspecified: Secondary | ICD-10-CM | POA: Insufficient documentation

## 2013-05-03 MED ORDER — PANTOPRAZOLE SODIUM 40 MG PO TBEC: 40.0000 mg | DELAYED_RELEASE_TABLET | Freq: Every day | ORAL | Status: DC

## 2013-05-03 MED ORDER — FERROUS SULFATE 325 (65 FE) MG PO TBEC: 325.0000 mg | DELAYED_RELEASE_TABLET | Freq: Three times a day (TID) | ORAL | Status: DC

## 2013-05-03 NOTE — Telephone Encounter (Signed)
lmtcb JX:BJYN with [place] this am

## 2013-05-03 NOTE — Assessment & Plan Note (Signed)
It seems to be a functional flow murmur.

## 2013-05-03 NOTE — Assessment & Plan Note (Signed)
Have chest pain is overall atypical and likely due to GERD. However, she does have risk factors for coronary artery disease including hypertension, obesity and family history. Thus, I recommend further evaluation with a stress echocardiogram. In the meantime, I will start her on Protonix 40 mg once daily for one month.

## 2013-05-03 NOTE — Assessment & Plan Note (Signed)
Had labs recently showed a hemoglobin of 11.7 with a low MCV. She reports heavy menstrual periods which are likely the cause of iron deficiency anemia. I asked her to start taking iron supplements and discuss this with Dr. Sherrie Mustache.

## 2013-05-03 NOTE — Progress Notes (Signed)
HPI  This is a pleasant 47 year old female who was referred by Dr. Sherrie Mustache for evaluation of chest pain. She has no previous cardiac history. She has chronic hypertension. Early this year her blood pressure was uncontrolled and was having frequent headaches. In May, she was started on hydrochlorothiazide which was followed in June by adding amlodipine. She reports family history of hypertension and also family history of coronary artery disease. Her father had myocardial infarction and stenting in his early 32s. She has been told in the past about possible pre diabetes with borderline elevation and hemoglobin A1c. She reports having chest pain 1or 2 years ago and was evaluated at [facility] cardiology in [place] with negative stress test. On Sunday, she had a substernal chest discomfort in the evening after a family cook out. This persisted throughout the night and woke her up from sleep on Monday. The chest pain continued and was associated with some shortness of breath. Thus, she went to the emergency room at [facility]. EKG and labs were overall unremarkable. That day she did complain of heartburn. She had a mild to brief episode a few days ago. She denies any palpitations, syncope or presyncope.   No Known Allergies   Current Outpatient Prescriptions on File Prior to Visit  Medication Sig Dispense Refill  . acetaminophen (TYLENOL) 500 MG tablet Take 1,000 mg by mouth every 6 (six) hours as needed for pain.       . hydrochlorothiazide (HYDRODIURIL) 25 MG tablet Take 25 mg by mouth daily.      Marland Kitchen HYDROcodone-acetaminophen (NORCO/VICODIN) 5-325 MG per tablet Take 1 tablet by mouth every 6 (six) hours as needed for pain.      Marland Kitchen ibuprofen (ADVIL,MOTRIN) 200 MG tablet Take 400 mg by mouth every 6 (six) hours as needed for pain (For headache.).       No current facility-administered medications on file prior to visit.     Past Medical History  Diagnosis Date  . Hypothyroid   . Hypertension       Past Surgical History  Procedure Laterality Date  . Tubal ligation    . Breast reduction surgery    . Carpal tunnel release    . Wisdom tooth extraction       Family History  Problem Relation Age of Onset  . Cancer Mother     MELANOMA  . Hypertension Father   . Heart disease Maternal Grandmother   . Heart disease Paternal Grandmother   . Hypertension Sister      History   Social History  . Marital Status: Married    Spouse Name: N/A    Number of Children: N/A  . Years of Education: N/A   Occupational History  . Not on file.   Social History Main Topics  . Smoking status: Never Smoker   . Smokeless tobacco: Never Used  . Alcohol Use: No     Comment: rarely  . Drug Use: No  . Sexually Active: Yes -- Female partner(s)    Birth Control/ Protection: Surgical   Other Topics Concern  . Not on file   Social History Narrative  . No narrative on file     ROS Constitutional: Negative for fever, chills, diaphoresis, activity change, appetite change and fatigue.  HENT: Negative for hearing loss, nosebleeds, congestion, sore throat, facial swelling, drooling, trouble swallowing, neck pain, voice change, sinus pressure and tinnitus.  Eyes: Negative for photophobia, pain, discharge and visual disturbance.  Respiratory: Negative for apnea, cough and  wheezing.  Cardiovascular: Negative for  palpitations and leg swelling.  Gastrointestinal: Negative for nausea, vomiting, abdominal pain, diarrhea, constipation, blood in stool and abdominal distention.  Genitourinary: Negative for dysuria, urgency, frequency, hematuria and decreased urine volume.  Musculoskeletal: Negative for myalgias, back pain, joint swelling, arthralgias and gait problem.  Skin: Negative for color change, pallor, rash and wound.  Neurological: Negative for dizziness, tremors, seizures, syncope, speech difficulty, weakness, light-headedness, numbness and headaches.  Psychiatric/Behavioral: Negative for  suicidal ideas, hallucinations, behavioral problems and agitation. The patient is not nervous/anxious.     PHYSICAL EXAM   BP 128/72  Pulse 80  Ht 5\' 4"  (1.626 m)  Wt 249 lb (112.946 kg)  BMI 42.72 kg/m2  LMP 04/22/2013 Constitutional: She is oriented to person, place, and time. She appears well-developed and well-nourished. No distress.  HENT: No nasal discharge.  Head: Normocephalic and atraumatic.  Eyes: Pupils are equal and round. Right eye exhibits no discharge. Left eye exhibits no discharge.  Neck: Normal range of motion. Neck supple. No JVD present. No thyromegaly present.  Cardiovascular: Normal rate, regular rhythm, normal heart sounds. Exam reveals no gallop and no friction rub. There is a 1/6 systolic ejection murmur at the base of the heart Pulmonary/Chest: Effort normal and breath sounds normal. No stridor. No respiratory distress. She has no wheezes. She has no rales. She exhibits no tenderness.  Abdominal: Soft. Bowel sounds are normal. She exhibits no distension. There is no tenderness. There is no rebound and no guarding.  Musculoskeletal: Normal range of motion. She exhibits no edema and no tenderness.  Neurological: She is alert and oriented to person, place, and time. Coordination normal.  Skin: Skin is warm and dry. No rash noted. She is not diaphoretic. No erythema. No pallor.  Psychiatric: She has a normal mood and affect. Her behavior is normal. Judgment and thought content normal.     [id number]  Rhythm  WITHIN NORMAL LIMITS   ASSESSMENT AND PLAN

## 2013-05-03 NOTE — Patient Instructions (Addendum)
Your physician has requested that you have a stress echocardiogram. For further information please visit [email or web address] Please follow instruction sheet as given.  Start Protonix 40 mg once daily for acid reflux.   Start taking iron supplement 325 mg twice daily for anemia.   Follow up as needed.

## 2013-05-03 NOTE — Assessment & Plan Note (Signed)
Blood pressure is now well controlled on current medications. 

## 2013-05-09 ENCOUNTER — Other Ambulatory Visit: Payer: 59

## 2014-08-12 ENCOUNTER — Encounter: Payer: Self-pay | Admitting: Women's Health

## 2014-08-12 ENCOUNTER — Ambulatory Visit (INDEPENDENT_AMBULATORY_CARE_PROVIDER_SITE_OTHER): Payer: 59 | Admitting: Women's Health

## 2014-08-12 VITALS — BP 110/78 | Ht 64.5 in | Wt 229.0 lb

## 2014-08-12 DIAGNOSIS — E079 Disorder of thyroid, unspecified: Secondary | ICD-10-CM

## 2014-08-12 DIAGNOSIS — Z01419 Encounter for gynecological examination (general) (routine) without abnormal findings: Secondary | ICD-10-CM

## 2014-08-12 DIAGNOSIS — Z23 Encounter for immunization: Secondary | ICD-10-CM

## 2014-08-12 NOTE — Patient Instructions (Signed)

## 2014-08-12 NOTE — Progress Notes (Signed)
Katrina Collier [birth year] [id number]    History:    Presents for annual exam.  Regular monthly cycle BTL. First 3 days of each cycle heavy flow. Questions thyroid problem. Normal Pap and mammogram history. Labs at health screening at work lipid panel cholesterol 192, HDL 62, triglycerides 70 and LDL 116. Glucose 76. Hemoglobin A1c 5.4%.  Past medical history, past surgical history, family history and social history were all reviewed and documented in the EPIC chart. Works at [employer]. Children 27, 26, 16 and 14. 26 sons still struggling. Husband prostate cancer recently had surgery and will need radiation therapy. Father, sister hypertension.  ROS:  A  12 point ROS was performed and pertinent positives and negatives are included.  Exam:  Filed Vitals:   08/12/14 1438  BP: 110/78    General appearance:  Normal Thyroid:  Symmetrical, normal in size, without palpable masses or nodularity. Respiratory  Auscultation:  Clear without wheezing or rhonchi Cardiovascular  Auscultation:  Regular rate, without rubs, murmurs or gallops  Edema/varicosities:  Not grossly evident Abdominal  Soft,nontender, without masses, guarding or rebound.  Liver/spleen:  No organomegaly noted  Hernia:  None appreciated  Skin  Inspection:  Grossly normal   Breasts: Examined lying and sitting.     Right: Without masses, retractions, discharge or axillary adenopathy.     Left: Without masses, retractions, discharge or axillary adenopathy. Gentitourinary   Inguinal/mons:  Normal without inguinal adenopathy  External genitalia:  Normal  BUS/Urethra/Skene's glands:  Normal  Vagina:  Normal  Cervix:  Normal  Uterus:   normal in size, shape and contour.  Midline and mobile  Adnexa/parametria:     Rt: Without masses or tenderness.   Lt: Without masses or tenderness.  Anus and perineum: Normal  Digital rectal exam: Normal sphincter tone without palpated masses or tenderness  Assessment/Plan:  48 y.o. MWF G4P4  for annual exam history of hypothyroidism with normal TSH in the past on no medication..     Monthly cycle first 3 days heavy flow/BTL Obesity Situational stress test husbands health  Plan: Options reviewed, declines need for medication for stress. SBE's, annual mammogram, reviewed importance of annual screen. Increase regular exercise and decrease calories for continued weight loss. Planning to try Weight Watchers. Has lost 22 pounds in the past year. Path normal 2014, new screening guidelines reviewed. TSH, T3, T4.    Harrington ChallengerYOUNG,Quantez Schnyder J Royal Oaks HospitalWHNP, 6:03 PM 08/12/2014

## 2014-09-08 ENCOUNTER — Encounter: Payer: Self-pay | Admitting: Women's Health

## 2014-11-11 ENCOUNTER — Ambulatory Visit: Payer: Self-pay | Admitting: Family Medicine

## 2015-05-01 ENCOUNTER — Telehealth: Payer: Self-pay | Admitting: Family Medicine

## 2015-05-01 NOTE — Telephone Encounter (Signed)
Pt stated her husband brought FMLA papers here the first part of June for Dr. Sherrie Mustache to complete and pt hasn't heard anything back. Pt stated she needs them by 05/06/15. Thanks TNP

## 2015-05-01 NOTE — Telephone Encounter (Signed)
Please advise 

## 2015-05-04 NOTE — Telephone Encounter (Signed)
Spoke with patient and advised her what was needed on the FMLA forms. Patient will call back tomorrow morning with the dates for intermittent leave.

## 2015-05-04 NOTE — Telephone Encounter (Signed)
Pt stated someone called pt's husband's phone to get more info for the FMLA forms and pt was returning the call but didn't know which nurse had called. Pt stated that we have had the forms for 2 weeks and she has to turn them in by Wednesday 05/06/15. Pt would like a nurse to call her back. Thanks TNP

## 2015-05-05 NOTE — Telephone Encounter (Signed)
Called patient. No answer. Left message for patient to call back.Question 3 (b) ii needs to be answered also.  Need to know how often the office visits will be every so many days and the duration of each visit .

## 2015-05-05 NOTE — Telephone Encounter (Signed)
Pt called stating the dates are from 03/16/2015 to 09/13/2015.  CB#[phone] for any questions.  Pt will need the paper work back by tomorrow/MJ

## 2015-05-06 NOTE — Telephone Encounter (Signed)
Patient returned call with this information. Forms have been completed and left up front for pick up. Left message on patient voicemail that forms are ready.

## 2015-07-29 ENCOUNTER — Encounter: Payer: Self-pay | Admitting: Family Medicine

## 2015-07-29 ENCOUNTER — Ambulatory Visit (INDEPENDENT_AMBULATORY_CARE_PROVIDER_SITE_OTHER): Payer: 59 | Admitting: Family Medicine

## 2015-07-29 VITALS — BP 110/74 | HR 70 | Temp 97.9°F | Resp 16 | Wt 248.0 lb

## 2015-07-29 DIAGNOSIS — Z23 Encounter for immunization: Secondary | ICD-10-CM | POA: Diagnosis not present

## 2015-07-29 DIAGNOSIS — M545 Low back pain, unspecified: Secondary | ICD-10-CM

## 2015-07-29 MED ORDER — METHOCARBAMOL 750 MG PO TABS: 750.0000 mg | ORAL_TABLET | Freq: Four times a day (QID) | ORAL | Status: DC

## 2015-07-29 MED ORDER — MELOXICAM 15 MG PO TABS
15.0000 mg | ORAL_TABLET | Freq: Every day | ORAL | Status: DC
Start: 2015-07-29 — End: 2016-06-21

## 2015-07-29 NOTE — Progress Notes (Signed)
Patient: Katrina Collier Female    DOB: 1966/10/10   49 y.o.   MRN: [id number] Visit Date: 07/29/2015  Today's Kristi Norment: [name], MD   Chief Complaint  Patient presents with  . Back Pain   Subjective:    Back Pain This is a recurrent problem. The current episode started in the past 7 days. The problem occurs constantly. The problem is unchanged. The pain is present in the lumbar spine. The quality of the pain is described as stabbing, shooting and cramping. Radiates to: radiates from side to side. The pain is at a severity of 8/10. The pain is moderate. The pain is worse during the night. The symptoms are aggravated by bending, position, standing, twisting and lying down. Stiffness is present all day. Pertinent negatives include no abdominal pain, bladder incontinence, bowel incontinence, chest pain, fever, headaches, pelvic pain, tingling or weakness. She has tried bed rest, heat and NSAIDs for the symptoms. The treatment provided mild relief.  She states pain is exactly the same as previous episodes of back pain which usually improve within a few days of starting cyclobenzaprine. However this time the cyclobenzaprine doesn't seem to be helping at all. Does not radiate into buttocks or legs and does not have any weakness in her legs.     No Known Allergies Previous Medications   ACETAMINOPHEN (TYLENOL) 500 MG TABLET    Take 1,000 mg by mouth every 6 (six) hours as needed for pain.    AMLODIPINE (NORVASC) 10 MG TABLET    Take 10 mg by mouth daily.   FERROUS SULFATE 325 (65 FE) MG EC TABLET    Take 1 tablet (325 mg total) by mouth 3 (three) times daily with meals.   HYDROCHLOROTHIAZIDE (HYDRODIURIL) 25 MG TABLET    Take 25 mg by mouth daily.   IBUPROFEN (ADVIL,MOTRIN) 200 MG TABLET    Take 400 mg by mouth every 6 (six) hours as needed for pain (For headache.).    Review of Systems  Constitutional: Negative for fever.  Cardiovascular: Negative for chest pain and  palpitations.  Gastrointestinal: Negative for abdominal pain and bowel incontinence.  Genitourinary: Negative for bladder incontinence and pelvic pain.  Musculoskeletal: Positive for myalgias and back pain.  Neurological: Negative for dizziness, tingling, weakness, light-headedness and headaches.    Social History  Substance Use Topics  . Smoking status: Never Smoker   . Smokeless tobacco: Never Used  . Alcohol Use: No     Comment: rarely   Objective:   BP 110/74 mmHg  Pulse 70  Temp(Src) 97.9 F (36.6 C) (Oral)  Resp 16  Wt 248 lb (112.492 kg)  SpO2 97%  LMP 07/10/2015  Physical Exam  General appearance: alert, well developed, well nourished, cooperative and in no distress Head: Normocephalic, without obvious abnormality, atraumatic Lungs: Respirations even and unlabored Extremities: No gross deformities Skin: Skin color, texture, turgor normal. No rashes seen  Psych: Appropriate mood and affect. Neurologic: Mental status: Alert, oriented to person, place, and time, thought content appropriate. MS: Mild to moderate tenderness of para-lumbar muscles. Slight tenderness of upper lumbar spine. No swelling, no erythema.      Assessment & Plan:     1. Bilateral low back pain without sciatica Recurrent, not responding to cyclobenzaprine as usual. Change muscle relaxer. Try meloxicam, advised she may take Tylenol in addition to prescriptions, but no other OTC pain relievers.  Discussed PT referral which she is going to consider.  - meloxicam (MOBIC)  15 MG tablet; Take 1 tablet (15 mg total) by mouth daily. Take with food  Dispense: 30 tablet; Refill: 0 - methocarbamol (ROBAXIN-750) 750 MG tablet; Take 1-2 tablets (750-1,500 mg total) by mouth 4 (four) times daily.  Dispense: 30 tablet; Refill: 1  2. Need for influenza vaccination  - Flu Vaccine QUAD 36+ mos IM       [name], MD  [facility]

## 2015-07-29 NOTE — Patient Instructions (Signed)

## 2015-09-23 ENCOUNTER — Ambulatory Visit (INDEPENDENT_AMBULATORY_CARE_PROVIDER_SITE_OTHER): Payer: 59 | Admitting: Family Medicine

## 2015-09-23 ENCOUNTER — Encounter: Payer: Self-pay | Admitting: Family Medicine

## 2015-09-23 VITALS — BP 122/80 | HR 67 | Temp 98.1°F | Resp 16 | Wt 241.0 lb

## 2015-09-23 DIAGNOSIS — R002 Palpitations: Secondary | ICD-10-CM

## 2015-09-23 DIAGNOSIS — G44209 Tension-type headache, unspecified, not intractable: Secondary | ICD-10-CM | POA: Diagnosis not present

## 2015-09-23 DIAGNOSIS — E611 Iron deficiency: Secondary | ICD-10-CM | POA: Diagnosis not present

## 2015-09-23 DIAGNOSIS — I10 Essential (primary) hypertension: Secondary | ICD-10-CM | POA: Insufficient documentation

## 2015-09-23 MED ORDER — BUTALBITAL-APAP-CAFFEINE 50-325-40 MG PO TABS: 1.0000 | ORAL_TABLET | Freq: Four times a day (QID) | ORAL | Status: AC | PRN

## 2015-09-23 NOTE — Progress Notes (Signed)
Patient: Katrina Collier Female    DOB: 1966/08/25   49 y.o.   MRN: [id number] Visit Date: 09/23/2015  Today's Provider: Lelon Huh, MD   Chief Complaint  Patient presents with  . Headache    x 3 days   Subjective:    HPI  States she developed dull, persitent occipital headache three days ago which has not let up since. Has had a few episodes of blurry vision similar to previous episodes when her blood pressure was elevated. There are no alleviating or aggravating factors. She has also not been sleeping well due to worsening of restless legs. She had been taking 2 iron tablets a day, but cut down to one a day several weeks ago.     No Known Allergies Previous Medications   ACETAMINOPHEN (TYLENOL) 500 MG TABLET    Take 1,000 mg by mouth every 6 (six) hours as needed for pain.    AMLODIPINE (NORVASC) 10 MG TABLET    Take 10 mg by mouth daily.   FERROUS SULFATE 325 (65 FE) MG EC TABLET    Take 1 tablet (325 mg total) by mouth 3 (three) times daily with meals.   HYDROCHLOROTHIAZIDE (HYDRODIURIL) 25 MG TABLET    Take 25 mg by mouth daily.   IBUPROFEN (ADVIL,MOTRIN) 200 MG TABLET    Take 400 mg by mouth every 6 (six) hours as needed for pain (For headache.).   MELOXICAM (MOBIC) 15 MG TABLET    Take 1 tablet (15 mg total) by mouth daily. Take with food   METHOCARBAMOL (ROBAXIN-750) 750 MG TABLET    Take 1-2 tablets (750-1,500 mg total) by mouth 4 (four) times daily.    Review of Systems  Constitutional: Positive for fatigue. Negative for chills and appetite change.  HENT: Positive for rhinorrhea. Negative for congestion, facial swelling, postnasal drip, sinus pressure, sneezing, sore throat and tinnitus.   Respiratory: Positive for chest tightness. Negative for cough and shortness of breath.   Cardiovascular: Positive for palpitations. Negative for chest pain and leg swelling.  Gastrointestinal: Negative for nausea, vomiting, diarrhea and abdominal distention.    Social  History  Substance Use Topics  . Smoking status: Never Smoker   . Smokeless tobacco: Never Used  . Alcohol Use: No     Comment: rarely   Objective:   BP 122/80 mmHg  Pulse 67  Temp(Src) 98.1 F (36.7 C) (Oral)  Resp 16  Wt 241 lb (109.317 kg)  SpO2 97%  Physical Exam   General Appearance:    Alert, cooperative, no distress  Eyes:    PERRL, conjunctiva/corneas clear, EOM's intact       Lungs:     Clear to auscultation bilaterally, respirations unlabored  Heart:    Regular rate and rhythm  Neurologic:   Awake, alert, oriented x 3. No apparent focal neurological           defect.   MS:   Mild tenderness over bilateral occiput and paracervical muscles.        Assessment & Plan:     1. Tension-type headache, not intractable, unspecified chronicity pattern  - CBC - Comprehensive metabolic panel - Sed Rate (ESR) - butalbital-acetaminophen-caffeine (FIORICET) 50-325-40 MG tablet; Take 1-2 tablets by mouth every 6 (six) hours as needed for headache.  Dispense: 20 tablet; Refill: 0  2. Iron deficiency  - Ferritin  3. Palpitations  - T4 AND TSH  4. Essential hypertension Well controlled.  Continue current medications.  5. RLS  A little worse lately, likely due to reducing iron intake.       Lelon Huh, MD  [facility]

## 2015-09-25 ENCOUNTER — Other Ambulatory Visit: Payer: Self-pay

## 2015-09-25 ENCOUNTER — Telehealth: Payer: Self-pay

## 2015-09-25 LAB — COMPREHENSIVE METABOLIC PANEL
A/G RATIO: 1.7 (ref 1.1–2.5)
ALBUMIN: 4.3 g/dL (ref 3.5–5.5)
ALT: 15 IU/L (ref 0–32)
AST: 18 IU/L (ref 0–40)
Alkaline Phosphatase: 77 IU/L (ref 39–117)
BUN/Creatinine Ratio: 14 (ref 9–23)
BUN: 10 mg/dL (ref 6–24)
Bilirubin Total: 0.3 mg/dL (ref 0.0–1.2)
CALCIUM: 9.6 mg/dL (ref 8.7–10.2)
CO2: 24 mmol/L (ref 18–29)
CREATININE: 0.74 mg/dL (ref 0.57–1.00)
Chloride: 98 mmol/L (ref 97–106)
GFR, EST AFRICAN AMERICAN: 111 mL/min/{1.73_m2} (ref >59)
GFR, EST NON AFRICAN AMERICAN: 96 mL/min/{1.73_m2} (ref >59)
GLOBULIN, TOTAL: 2.5 g/dL (ref 1.5–4.5)
Glucose: 94 mg/dL (ref 65–99)
POTASSIUM: 3.6 mmol/L (ref 3.5–5.2)
SODIUM: 141 mmol/L (ref 136–144)
TOTAL PROTEIN: 6.8 g/dL (ref 6.0–8.5)

## 2015-09-25 LAB — CBC
HEMATOCRIT: 35.9 % (ref 34.0–46.6)
HEMOGLOBIN: 11.9 g/dL (ref 11.1–15.9)
MCH: 24.6 pg — ABNORMAL LOW (ref 26.6–33.0)
MCHC: 33.1 g/dL (ref 31.5–35.7)
MCV: 74 fL — AB (ref 79–97)
Platelets: 375 10*3/uL (ref 150–379)
RBC: 4.83 x10E6/uL (ref 3.77–5.28)
RDW: 15.7 % — ABNORMAL HIGH (ref 12.3–15.4)
WBC: 8.1 10*3/uL (ref 3.4–10.8)

## 2015-09-25 LAB — FERRITIN: Ferritin: 11 ng/mL — ABNORMAL LOW (ref 15–150)

## 2015-09-25 LAB — T4 AND TSH
T4, Total: 8.1 ug/dL (ref 4.5–12.0)
TSH: 1.78 u[IU]/mL (ref 0.450–4.500)

## 2015-09-25 LAB — SEDIMENTATION RATE: Sed Rate: 19 mm/hr (ref 0–32)

## 2015-09-25 MED ORDER — ZOLPIDEM TARTRATE 5 MG PO TABS: 5.0000 mg | ORAL_TABLET | Freq: Every evening | ORAL | Status: DC | PRN

## 2015-09-25 NOTE — Telephone Encounter (Signed)
-----   Message from Malva Limesonald E Fisher, MD sent at 09/25/2015  7:47 AM EST ----- Iron levels are very low, which is probably why restless legs are worse and probably contributing to headaches. Need to go up to two iron tablets a day. All other labs are normal.

## 2015-09-25 NOTE — Telephone Encounter (Signed)
Called in medication as below. Patient advised.

## 2015-09-25 NOTE — Telephone Encounter (Signed)
Please call in zolpidem 5mg  one at bedtime as needed, #20, rf x 1.

## 2015-09-25 NOTE — Telephone Encounter (Signed)
Advised patient as below. Patient reports that she is unable to sleep at night. She reports that you gave her something in the past which helped but can not remember the name. She is requesting that it be sent into the pharmacy. Patient uses [facility] on [address].

## 2015-10-06 ENCOUNTER — Encounter: Payer: Self-pay | Admitting: Family Medicine

## 2015-10-13 ENCOUNTER — Other Ambulatory Visit: Payer: Self-pay | Admitting: Family Medicine

## 2015-10-13 NOTE — Telephone Encounter (Signed)
Pt contacted office for refill request on the following medications: 1. amLODipine (NORVASC) 10 MG tablet  2. [facility]) [facility] [address] [place]. Thanks TNP

## 2015-10-14 MED ORDER — AMLODIPINE BESYLATE 10 MG PO TABS: 10.0000 mg | ORAL_TABLET | Freq: Every day | ORAL | Status: DC

## 2015-10-14 MED ORDER — HYDROCHLOROTHIAZIDE 25 MG PO TABS: 25.0000 mg | ORAL_TABLET | Freq: Every day | ORAL | Status: DC

## 2015-10-15 ENCOUNTER — Ambulatory Visit (INDEPENDENT_AMBULATORY_CARE_PROVIDER_SITE_OTHER): Payer: 59 | Admitting: Family Medicine

## 2015-10-15 ENCOUNTER — Encounter: Payer: Self-pay | Admitting: Family Medicine

## 2015-10-15 VITALS — BP 126/70 | HR 74 | Temp 98.5°F | Resp 20 | Wt 251.0 lb

## 2015-10-15 DIAGNOSIS — R05 Cough: Secondary | ICD-10-CM

## 2015-10-15 DIAGNOSIS — R059 Cough, unspecified: Secondary | ICD-10-CM

## 2015-10-15 DIAGNOSIS — J40 Bronchitis, not specified as acute or chronic: Secondary | ICD-10-CM

## 2015-10-15 MED ORDER — AZITHROMYCIN 250 MG PO TABS: ORAL_TABLET | ORAL | Status: AC

## 2015-10-15 MED ORDER — HYDROCODONE-HOMATROPINE 5-1.5 MG/5ML PO SYRP: 5.0000 mL | ORAL_SOLUTION | Freq: Three times a day (TID) | ORAL | Status: DC | PRN

## 2015-10-15 NOTE — Progress Notes (Signed)
Patient ID: Katrina Collier, female   DOB: [birth year], 49 y.o.   MRN: [id number]       Patient: Katrina Lulasracey Annette Mabus Female    DOB: [birth year]   49 y.o.   MRN: [id number] Visit Date: 10/15/2015  Today's Provider: Mila Merryonald Fisher, MD   Chief Complaint  Patient presents with  . URI    X 1-2 weeks.    Subjective:    URI  This is a new problem. The current episode started 1 to 4 weeks ago (about 10 days). The problem has been gradually worsening. Maximum temperature: felt feverish yesterday. Associated symptoms include chest pain, congestion, coughing, headaches, a plugged ear sensation, rhinorrhea, sinus pain, sneezing and a sore throat. Pertinent negatives include no wheezing. She has tried antihistamine and decongestant for the symptoms. The treatment provided no relief.   Using Mucinex-DM and Delsym which helps a little bit, but still couging up some green mucous    No Known Allergies Previous Medications   ACETAMINOPHEN (TYLENOL) 500 MG TABLET    Take 1,000 mg by mouth every 6 (six) hours as needed for pain.    AMLODIPINE (NORVASC) 10 MG TABLET    Take 1 tablet (10 mg total) by mouth daily.   BUTALBITAL-ACETAMINOPHEN-CAFFEINE (FIORICET) 50-325-40 MG TABLET    Take 1-2 tablets by mouth every 6 (six) hours as needed for headache.   FERROUS SULFATE 325 (65 FE) MG EC TABLET    Take 1 tablet (325 mg total) by mouth 3 (three) times daily with meals.   HYDROCHLOROTHIAZIDE (HYDRODIURIL) 25 MG TABLET    Take 1 tablet (25 mg total) by mouth daily.   IBUPROFEN (ADVIL,MOTRIN) 200 MG TABLET    Take 400 mg by mouth every 6 (six) hours as needed for pain (For headache.).   MELOXICAM (MOBIC) 15 MG TABLET    Take 1 tablet (15 mg total) by mouth daily. Take with food   METHOCARBAMOL (ROBAXIN-750) 750 MG TABLET    Take 1-2 tablets (750-1,500 mg total) by mouth 4 (four) times daily.   ZOLPIDEM (AMBIEN) 5 MG TABLET    Take 1 tablet (5 mg total) by mouth at bedtime as needed for sleep.    Review of  Systems  HENT: Positive for congestion, rhinorrhea, sneezing and sore throat.   Respiratory: Positive for cough. Negative for wheezing.   Cardiovascular: Positive for chest pain.  Neurological: Positive for headaches.    Social History  Substance Use Topics  . Smoking status: Never Smoker   . Smokeless tobacco: Never Used  . Alcohol Use: No     Comment: rarely   Objective:   BP 126/70 mmHg  Pulse 74  Temp(Src) 98.5 F (36.9 C)  Resp 20  Wt 251 lb (113.853 kg)  SpO2 97%  Physical Exam  General Appearance:    Alert, cooperative, no distress  HENT:   ENT exam normal, no neck nodes or sinus tenderness  Eyes:    PERRL, conjunctiva/corneas clear, EOM's intact       Lungs:     Diffuse bilateral expiratory wheeze, mild, no rales, respirations unlabored  Heart:    Regular rate and rhythm  Neurologic:   Awake, alert, oriented x 3. No apparent focal neurological           defect.           Assessment & Plan:     1. Bronchitis  - azithromycin (ZITHROMAX) 250 MG tablet; 2 by mouth today, then 1 daily for 4 days  Dispense:  6 tablet; Refill: 0  2. Cough  - HYDROcodone-homatropine (HYCODAN) 5-1.5 MG/5ML syrup; Take 5 mLs by mouth every 8 (eight) hours as needed for cough.  Dispense: 120 mL; Refill: 0   Call if symptoms change or if not rapidly improving.          [name], MD  [facility]

## 2015-10-22 ENCOUNTER — Telehealth: Payer: Self-pay | Admitting: Family Medicine

## 2015-10-22 MED ORDER — NYSTATIN 100000 UNIT/ML MT SUSP: 5.0000 mL | Freq: Four times a day (QID) | OROMUCOSAL | Status: DC

## 2015-10-22 NOTE — Telephone Encounter (Signed)
Left message to call back to see which pharmacy patient would like medication sent into.

## 2015-10-22 NOTE — Telephone Encounter (Signed)
She probably has a little thrush. Can send nystatin solution to the pharmacy of her choice.

## 2015-10-22 NOTE — Telephone Encounter (Signed)
Pt just finished zpak and is experiencing white tongue, sore and a little swollen.  She is questioning yeast.  Please call her back at [phone]  Thanks teri

## 2015-10-22 NOTE — Telephone Encounter (Signed)
Patient reports that she would like it sent into [facility] on [address]. How is this written, or are you going to send in? Please advise. Thanks!

## 2015-10-22 NOTE — Telephone Encounter (Signed)
The  Prescription was already entered into the encounter. i didn't sign it because the original message didn't say what pharmacy she wanted to use.   I went ahead and changed the pharmacy signed it.

## 2015-10-22 NOTE — Telephone Encounter (Signed)
Returned patient's call. Patient finished zpak 2 days ago and this morning is the first time she's notified that her tongue was white. Patient stated that white coating on tongue is thick and parts of her tongue feels sore. Please advise?

## 2015-11-30 ENCOUNTER — Other Ambulatory Visit: Payer: Self-pay | Admitting: Family Medicine

## 2016-03-16 ENCOUNTER — Ambulatory Visit (INDEPENDENT_AMBULATORY_CARE_PROVIDER_SITE_OTHER): Payer: 59 | Admitting: Family Medicine

## 2016-03-16 ENCOUNTER — Encounter: Payer: Self-pay | Admitting: Family Medicine

## 2016-03-16 VITALS — BP 120/78 | HR 77 | Temp 97.8°F | Resp 20 | Wt 262.0 lb

## 2016-03-16 DIAGNOSIS — J329 Chronic sinusitis, unspecified: Secondary | ICD-10-CM

## 2016-03-16 MED ORDER — AMOXICILLIN 500 MG PO CAPS: 1000.0000 mg | ORAL_CAPSULE | Freq: Two times a day (BID) | ORAL | Status: AC

## 2016-03-16 NOTE — Patient Instructions (Signed)

## 2016-03-16 NOTE — Progress Notes (Signed)
Patient: Katrina Collier Female    DOB: December 15, 1965   50 y.o.   MRN: [id number] Visit Date: 03/16/2016  Today's Provider: Mila Merryonald Fisher, MD   Chief Complaint  Patient presents with  . Sinusitis   Subjective:    HPI Cold Symptoms: Patient comes in today complaining of cold symptoms that include sore throat, deep productive cough (yellow phlegm), runny nose, nasal congestion, headache, fever (subjective), shortness of breath and PND. Symptoms started 11 days ago and are worsening. Patient states it hurts to cough. Patient has tried taking Mucinex, Claritin, and Tylenol with mild relief.      No Known Allergies Previous Medications   ACETAMINOPHEN (TYLENOL) 500 MG TABLET    Take 1,000 mg by mouth every 6 (six) hours as needed for pain.    AMLODIPINE (NORVASC) 10 MG TABLET    Take 1 tablet by mouth  daily   BUTALBITAL-ACETAMINOPHEN-CAFFEINE (FIORICET) 50-325-40 MG TABLET    Take 1-2 tablets by mouth every 6 (six) hours as needed for headache.   FERROUS SULFATE 325 (65 FE) MG EC TABLET    Take 1 tablet (325 mg total) by mouth 3 (three) times daily with meals.   HYDROCHLOROTHIAZIDE (HYDRODIURIL) 25 MG TABLET    Take 1 tablet (25 mg total) by mouth daily.   IBUPROFEN (ADVIL,MOTRIN) 200 MG TABLET    Take 400 mg by mouth every 6 (six) hours as needed for pain (For headache.).   MELOXICAM (MOBIC) 15 MG TABLET    Take 1 tablet (15 mg total) by mouth daily. Take with food   METHOCARBAMOL (ROBAXIN-750) 750 MG TABLET    Take 1-2 tablets (750-1,500 mg total) by mouth 4 (four) times daily.   ZOLPIDEM (AMBIEN) 5 MG TABLET    Take 1 tablet (5 mg total) by mouth at bedtime as needed for sleep.    Review of Systems  Constitutional: Positive for fever and fatigue. Negative for chills, diaphoresis and appetite change.  HENT: Positive for congestion, ear pain (pressure in left ear), postnasal drip, sinus pressure, sneezing, sore throat, trouble swallowing and voice change. Negative for mouth  sores and nosebleeds.   Eyes: Positive for discharge (watery).  Respiratory: Positive for cough and shortness of breath. Negative for chest tightness.   Cardiovascular: Negative for chest pain and palpitations.  Gastrointestinal: Negative for nausea, vomiting and abdominal pain.  Neurological: Positive for headaches. Negative for dizziness and weakness.    Social History  Substance Use Topics  . Smoking status: Never Smoker   . Smokeless tobacco: Never Used  . Alcohol Use: 0.0 oz/week    0 Standard drinks or equivalent per week     Comment: rarely   Objective:   BP 120/78 mmHg  Pulse 77  Temp(Src) 97.8 F (36.6 C) (Oral)  Resp 20  Wt 262 lb (118.842 kg)  SpO2 98%  Physical Exam  General Appearance:    Alert, cooperative, no distress  HENT:   bilateral TM normal without fluid or infection, neck without nodes, throat normal without erythema or exudate, maxillary sinus tender and nasal mucosa pale and congested  Eyes:    PERRL, conjunctiva/corneas clear, EOM's intact       Lungs:     Clear to auscultation bilaterally, respirations unlabored  Heart:    Regular rate and rhythm  Neurologic:   Awake, alert, oriented x 3. No apparent focal neurological           defect.  Assessment & Plan:     1. Sinusitis, unspecified chronicity, unspecified location  - amoxicillin (AMOXIL) 500 MG capsule; Take 2 capsules (1,000 mg total) by mouth 2 (two) times daily.  Dispense: 40 capsule; Refill: 0  OTC Nasal saline.  Call if symptoms change or if not rapidly improving.           [name], MD  [facility]

## 2016-03-29 ENCOUNTER — Encounter: Payer: Self-pay | Admitting: Family Medicine

## 2016-03-29 ENCOUNTER — Ambulatory Visit (INDEPENDENT_AMBULATORY_CARE_PROVIDER_SITE_OTHER): Payer: 59 | Admitting: Family Medicine

## 2016-03-29 VITALS — BP 100/70 | HR 76 | Temp 98.0°F | Resp 16 | Wt 258.0 lb

## 2016-03-29 DIAGNOSIS — N390 Urinary tract infection, site not specified: Secondary | ICD-10-CM

## 2016-03-29 DIAGNOSIS — R1031 Right lower quadrant pain: Secondary | ICD-10-CM | POA: Diagnosis not present

## 2016-03-29 DIAGNOSIS — N898 Other specified noninflammatory disorders of vagina: Secondary | ICD-10-CM | POA: Diagnosis not present

## 2016-03-29 DIAGNOSIS — R319 Hematuria, unspecified: Secondary | ICD-10-CM

## 2016-03-29 LAB — POCT URINALYSIS DIPSTICK
Bilirubin, UA: NEGATIVE
Glucose, UA: NEGATIVE
Ketones, UA: NEGATIVE
NITRITE UA: POSITIVE
PH UA: 6
Protein, UA: NEGATIVE
Spec Grav, UA: 1.01
UROBILINOGEN UA: 0.2

## 2016-03-29 MED ORDER — CIPROFLOXACIN HCL 500 MG PO TABS: 500.0000 mg | ORAL_TABLET | Freq: Two times a day (BID) | ORAL | Status: DC

## 2016-03-29 NOTE — Progress Notes (Signed)
Patient: Katrina Collier Female    DOB: [birth year]   50 y.o.   MRN: [id number] Visit Date: 03/29/2016  Today's Provider: [name], MD   Chief Complaint  Patient presents with  . Abdominal Pain  . Diarrhea   Subjective:    Abdominal Pain This is a new problem. The current episode started in the past 7 days (4 days). The problem occurs constantly. The problem has been gradually worsening. The pain is located in the LLQ. The pain is at a severity of 3/10. The pain is mild. The quality of the pain is aching and cramping. Associated symptoms include constipation, diarrhea, flatus, frequency, nausea and weight loss. Pertinent negatives include no anorexia, arthralgias, belching, dysuria, fever, headaches, hematochezia, hematuria, melena, myalgias or vomiting. The pain is aggravated by movement and palpation. The pain is relieved by bowel movements and being still. She has tried acetaminophen for the symptoms. The treatment provided no relief.    Abdominal pain since Friday 03/25/2016, Left lower quadrant. Patient has had some diarrhea, vaginal discharge, cloudy urine. Patient has been taking amoxicillin for sinus infection which has resolved. Pain has improved quite a bit over the last 24 hours.     No Known Allergies Previous Medications   ACETAMINOPHEN (TYLENOL) 500 MG TABLET    Take 1,000 mg by mouth every 6 (six) hours as needed for pain.    AMLODIPINE (NORVASC) 10 MG TABLET    Take 1 tablet by mouth  daily   AMOXICILLIN PO    Take by mouth.   BUTALBITAL-ACETAMINOPHEN-CAFFEINE (FIORICET) 50-325-40 MG TABLET    Take 1-2 tablets by mouth every 6 (six) hours as needed for headache.   FERROUS SULFATE 325 (65 FE) MG EC TABLET    Take 1 tablet (325 mg total) by mouth 3 (three) times daily with meals.   HYDROCHLOROTHIAZIDE (HYDRODIURIL) 25 MG TABLET    Take 1 tablet (25 mg total) by mouth daily.   IBUPROFEN (ADVIL,MOTRIN) 200 MG TABLET    Take 400 mg by mouth every 6 (six) hours  as needed for pain (For headache.).   MELOXICAM (MOBIC) 15 MG TABLET    Take 1 tablet (15 mg total) by mouth daily. Take with food   METHOCARBAMOL (ROBAXIN-750) 750 MG TABLET    Take 1-2 tablets (750-1,500 mg total) by mouth 4 (four) times daily.   ZOLPIDEM (AMBIEN) 5 MG TABLET    Take 1 tablet (5 mg total) by mouth at bedtime as needed for sleep.    Review of Systems  Constitutional: Positive for weight loss. Negative for fever, chills, appetite change and fatigue.  Respiratory: Negative for chest tightness and shortness of breath.   Cardiovascular: Negative for chest pain and palpitations.  Gastrointestinal: Positive for nausea, abdominal pain, diarrhea, constipation, abdominal distention and flatus. Negative for vomiting, melena, hematochezia and anorexia.  Genitourinary: Positive for urgency, frequency, decreased urine volume, vaginal discharge and difficulty urinating. Negative for dysuria and hematuria.  Musculoskeletal: Negative for myalgias and arthralgias.  Neurological: Negative for dizziness, weakness and headaches.    Social History  Substance Use Topics  . Smoking status: Never Smoker   . Smokeless tobacco: Never Used  . Alcohol Use: 0.0 oz/week    0 Standard drinks or equivalent per week     Comment: rarely   Objective:   BP 100/70 mmHg  Pulse 76  Temp(Src) 98 F (36.7 C) (Oral)  Resp 16  Wt 258 lb (117.028 kg)  SpO2 98%  LMP  03/23/2016  Physical Exam  General Appearance:    Alert, cooperative, no distress  Eyes:    PERRL, conjunctiva/corneas clear, EOM's intact       Lungs:     Clear to auscultation bilaterally, respirations unlabored  Heart:    Regular rate and rhythm  Abdomen:   bowel sounds present and normal in all 4 quadrants. No CVA tenderness. Mild RLQ tenderness.     Results for orders placed or performed in visit on 03/29/16  POCT urinalysis dipstick  Result Value Ref Range   Color, UA Yellow    Clarity, UA Cloudy    Glucose, UA Neg     Bilirubin, UA Neg    Ketones, UA Neg    Spec Grav, UA 1.010    Blood, UA Moderate    pH, UA 6.0    Protein, UA Neg    Urobilinogen, UA 0.2    Nitrite, UA Positive    Leukocytes, UA large (3+) (A) Negative        Assessment & Plan:     1. RLQ abdominal pain Ddx includes UTI, diverticulitis and renal calculi. Start Cipro as below and call if not resolved in a few days.   2. Urinary tract infection with hematuria, site unspecified Cipro 500 BID for 7 days. Recommend she take with probiotics - Urine culture - POCT urinalysis dipstick  3. Vaginal discharge Clear discharge with no itching or burning. Call if not resolving after finishing antibiotics.        Mila Merryonald Fisher, MD  [facility]

## 2016-03-29 NOTE — Patient Instructions (Signed)
Take OTC Probiotics with every dose of antiobiotic.

## 2016-03-31 ENCOUNTER — Telehealth: Payer: Self-pay

## 2016-03-31 LAB — URINE CULTURE

## 2016-03-31 MED ORDER — SULFAMETHOXAZOLE-TRIMETHOPRIM 800-160 MG PO TABS: 1.0000 | ORAL_TABLET | Freq: Two times a day (BID) | ORAL | Status: DC

## 2016-03-31 NOTE — Telephone Encounter (Signed)
Advised patient as below. Medication was sent into the pharmacy.  

## 2016-03-31 NOTE — Telephone Encounter (Signed)
-----   Message from Malva Limesonald E Fisher, MD sent at 03/31/2016  1:04 PM EDT ----- UTI is resistant to Cipro. Need to change to septra DS one tablet BID for 7 days.

## 2016-04-25 ENCOUNTER — Other Ambulatory Visit: Payer: Self-pay | Admitting: Family Medicine

## 2016-04-25 MED ORDER — HYDROCHLOROTHIAZIDE 25 MG PO TABS: 25.0000 mg | ORAL_TABLET | Freq: Every day | ORAL | Status: DC

## 2016-04-25 MED ORDER — AMLODIPINE BESYLATE 10 MG PO TABS: ORAL_TABLET | ORAL | Status: DC

## 2016-04-25 NOTE — Telephone Encounter (Signed)
Please advise 

## 2016-04-25 NOTE — Telephone Encounter (Signed)
Pt states she misplaced her BP meds amLODipine (NORVASC) 10 MG tablet and hydrochlorothiazide (HYDRODIURIL) 25 MG tablet , she would like to have one month refill called to [facility] on [address]

## 2016-05-20 ENCOUNTER — Other Ambulatory Visit: Payer: Self-pay

## 2016-05-21 MED ORDER — HYDROCHLOROTHIAZIDE 25 MG PO TABS: 25.0000 mg | ORAL_TABLET | Freq: Every day | ORAL | Status: DC

## 2016-05-26 ENCOUNTER — Other Ambulatory Visit: Payer: Self-pay | Admitting: *Deleted

## 2016-05-26 MED ORDER — HYDROCHLOROTHIAZIDE 25 MG PO TABS: 25.0000 mg | ORAL_TABLET | Freq: Every day | ORAL | Status: DC

## 2016-06-10 ENCOUNTER — Other Ambulatory Visit: Payer: Self-pay | Admitting: Family Medicine

## 2016-06-10 DIAGNOSIS — M545 Low back pain, unspecified: Secondary | ICD-10-CM

## 2016-06-10 MED ORDER — METHOCARBAMOL 750 MG PO TABS: 750.0000 mg | ORAL_TABLET | Freq: Four times a day (QID) | ORAL | 5 refills | Status: DC

## 2016-06-10 NOTE — Telephone Encounter (Signed)
Pt contacted office for refill request on the following medications:  methocarbamol (ROBAXIN-750) 750 MG tablet.  [facility] [address]  OX#[phone]/AS

## 2016-06-21 ENCOUNTER — Ambulatory Visit (INDEPENDENT_AMBULATORY_CARE_PROVIDER_SITE_OTHER): Payer: 59 | Admitting: Family Medicine

## 2016-06-21 ENCOUNTER — Ambulatory Visit
Admission: RE | Admit: 2016-06-21 | Discharge: 2016-06-21 | Disposition: A | Discharge status: Home / Self Care | Payer: 59 | Source: Ambulatory Visit | Attending: Family Medicine | Admitting: Family Medicine

## 2016-06-21 ENCOUNTER — Encounter: Payer: Self-pay | Admitting: Family Medicine

## 2016-06-21 ENCOUNTER — Other Ambulatory Visit: Payer: Self-pay

## 2016-06-21 VITALS — BP 100/70 | HR 68 | Temp 98.2°F | Resp 16 | Ht 64.75 in | Wt 254.0 lb

## 2016-06-21 DIAGNOSIS — M546 Pain in thoracic spine: Secondary | ICD-10-CM

## 2016-06-21 DIAGNOSIS — M545 Low back pain, unspecified: Secondary | ICD-10-CM

## 2016-06-21 DIAGNOSIS — M419 Scoliosis, unspecified: Secondary | ICD-10-CM | POA: Diagnosis not present

## 2016-06-21 MED ORDER — MELOXICAM 15 MG PO TABS: 15.0000 mg | ORAL_TABLET | Freq: Every day | ORAL | 4 refills | Status: DC

## 2016-06-21 NOTE — Telephone Encounter (Signed)
Patient advised as below. Patient requesting refill on meloxicam.

## 2016-06-21 NOTE — Telephone Encounter (Signed)
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 She need to alternate application of heat and ice to back every 3-4 hours throughout the day. Continue meloxicam and muscle relaxer. Should consider physical therapy if not improving over the next several days.

## 2016-06-21 NOTE — Addendum Note (Signed)
Addended by: Benjiman CoreHAMBERS, ROSHENA L on: 06/21/2016 09:50 AM   Modules accepted: Orders

## 2016-06-21 NOTE — Progress Notes (Signed)
       Patient: Katrina Collier Female    DOB: 02-27-1966   50 y.o.   MRN: [id number] Visit Date: 06/21/2016  Today's Provider: Mila Merryonald Ayomikun Starling, MD   Chief Complaint  Patient presents with  . Back Pain   Subjective:    HPI Back Pain: Patient presents for presents evaluation of  back problems.  Symptoms have been present for 2 weeks and include pain in upper back (aching, sharp and throbbing in character; 7/10 in severity) and stiffness in left side of back. Initial inciting event: none. Symptoms are worst: about the same all day. Alleviating factors identifiable by patient are medication Meloxicam. Exacerbating factors identifiable by patient are sitting and standing. Treatments so far initiated by patient: none Previous lower back problems: september. Previous workup: none. Previous treatments:  oral medications. Patient reports taking Meloxicam 15 mg daily.  She states current pain is similar to previous episodes, but is lasting longer and is more severe. She has not been applying heat and ice as she has done in the past, and is also working more with less time to rest than in the past. She still has Robaxin that she has been taking, but is nearly out of Meloxicam.     No Known Allergies Current Meds  Medication Sig  . acetaminophen (TYLENOL) 500 MG tablet Take 1,000 mg by mouth every 6 (six) hours as needed for pain.   Marland Kitchen. amLODipine (NORVASC) 10 MG tablet Take 1 tablet by mouth  daily  . butalbital-acetaminophen-caffeine (FIORICET) 50-325-40 MG tablet Take 1-2 tablets by mouth every 6 (six) hours as needed for headache.  . ferrous sulfate 325 (65 FE) MG EC tablet Take 1 tablet (325 mg total) by mouth 3 (three) times daily with meals.  . hydrochlorothiazide (HYDRODIURIL) 25 MG tablet Take 1 tablet (25 mg total) by mouth daily.  . meloxicam (MOBIC) 15 MG tablet Take 1 tablet (15 mg total) by mouth daily. Take with food  . methocarbamol (ROBAXIN-750) 750 MG tablet Take 1-2 tablets  (750-1,500 mg total) by mouth 4 (four) times daily.  Marland Kitchen. zolpidem (AMBIEN) 5 MG tablet Take 1 tablet (5 mg total) by mouth at bedtime as needed for sleep.    Review of Systems  Constitutional: Negative.   Musculoskeletal: Positive for back pain and myalgias.    Social History  Substance Use Topics  . Smoking status: Never Smoker  . Smokeless tobacco: Never Used  . Alcohol use 0.0 oz/week     Comment: rarely   Objective:   BP 100/70 (BP Location: Right Arm, Patient Position: Sitting, Cuff Size: Large)   Pulse 68   Temp 98.2 F (36.8 C) (Oral)   Resp 16   Ht 5' 4.75" (1.645 m)   Wt 254 lb (115.2 kg)   LMP 06/18/2016 (Exact Date)   BMI 42.59 kg/m   Physical Exam  General appearance: alert, well developed, well nourished, cooperative and in no distress Head: Normocephalic, without obvious abnormality, atraumatic Respiratory: Respirations even and unlabored, normal respiratory rate MS: Tender mid parathoracic muscles. Slight spine tenderness. .     Assessment & Plan:     1. Bilateral thoracic back pain More persistent than usual and has not responded to meloxicam and Robaxin as in the past. Obtain Xrays. Consider PT - DG Thoracic Spine 4V; Future       Mila Merryonald Fidencia Mccloud, MD  [facility]

## 2016-10-07 ENCOUNTER — Telehealth: Payer: Self-pay | Admitting: Family Medicine

## 2016-10-07 DIAGNOSIS — M545 Low back pain, unspecified: Secondary | ICD-10-CM

## 2016-10-07 MED ORDER — MELOXICAM 15 MG PO TABS: 15.0000 mg | ORAL_TABLET | Freq: Every day | ORAL | 4 refills | Status: DC

## 2016-10-07 MED ORDER — METHOCARBAMOL 750 MG PO TABS: 750.0000 mg | ORAL_TABLET | Freq: Four times a day (QID) | ORAL | 5 refills | Status: DC

## 2016-10-07 NOTE — Telephone Encounter (Signed)
Please review-aa 

## 2016-10-07 NOTE — Telephone Encounter (Signed)
Pt needs refills  methocarbamol (ROBAXIN-750) 750 MG tablet  meloxicam (MOBIC) 15 MG tablet  [facility] [address]  Pt is still having left side upper back pain  Pt's call back is [phone]  Thanks Ter

## 2016-10-11 DIAGNOSIS — K219 Gastro-esophageal reflux disease without esophagitis: Secondary | ICD-10-CM | POA: Insufficient documentation

## 2017-02-02 DIAGNOSIS — K760 Fatty (change of) liver, not elsewhere classified: Secondary | ICD-10-CM | POA: Insufficient documentation

## 2017-03-22 ENCOUNTER — Encounter: Payer: Self-pay | Admitting: Gynecology

## 2017-03-30 ENCOUNTER — Telehealth: Payer: Self-pay | Admitting: Family Medicine

## 2017-03-30 NOTE — Telephone Encounter (Signed)
Pt states she is having weight loss surgery and is request a Rx in liquid form.  [facility] [address]  CB#[phone]/MW  amLODipine (NORVASC) 10 MG tablet  hydrochlorothiazide (HYDRODIURIL) 25 MG tablet

## 2017-03-31 NOTE — Telephone Encounter (Signed)
Please review. Thanks!  

## 2017-03-31 NOTE — Telephone Encounter (Signed)
Pt advised-aa 

## 2017-03-31 NOTE — Telephone Encounter (Signed)
They don't come in liquid form, but they can be crushed and mixed with applesauce.

## 2017-04-14 ENCOUNTER — Other Ambulatory Visit: Payer: Self-pay | Admitting: Family Medicine

## 2017-04-26 ENCOUNTER — Encounter: Payer: Self-pay | Admitting: Family Medicine

## 2017-04-26 ENCOUNTER — Ambulatory Visit (INDEPENDENT_AMBULATORY_CARE_PROVIDER_SITE_OTHER): Payer: 59 | Admitting: Family Medicine

## 2017-04-26 VITALS — BP 120/70 | HR 85 | Temp 98.1°F | Resp 16 | Wt 237.0 lb

## 2017-04-26 DIAGNOSIS — G4700 Insomnia, unspecified: Secondary | ICD-10-CM | POA: Insufficient documentation

## 2017-04-26 DIAGNOSIS — I10 Essential (primary) hypertension: Secondary | ICD-10-CM

## 2017-04-26 DIAGNOSIS — Z1211 Encounter for screening for malignant neoplasm of colon: Secondary | ICD-10-CM

## 2017-04-26 MED ORDER — ZOLPIDEM TARTRATE 5 MG PO TABS
5.0000 mg | ORAL_TABLET | Freq: Every evening | ORAL | 3 refills | Status: DC | PRN
Start: 2017-04-26 — End: 2019-08-16

## 2017-04-26 MED ORDER — AMLODIPINE BESYLATE 10 MG PO TABS: 10.0000 mg | ORAL_TABLET | Freq: Every day | ORAL | 3 refills | Status: DC

## 2017-04-26 MED ORDER — HYDROCHLOROTHIAZIDE 25 MG PO TABS: 25.0000 mg | ORAL_TABLET | Freq: Every day | ORAL | 3 refills | Status: DC

## 2017-04-26 NOTE — Patient Instructions (Signed)
You are due for a screening colonoscopy to prevent colon cancer. You can call our office at any time to schedule referral to a gastroenterologist.

## 2017-04-26 NOTE — Progress Notes (Signed)
Patient: Katrina Collier Female    DOB: November 08, 1965   51 y.o.   MRN: [id number] Visit Date: 04/26/2017  Today's Provider: [name], MD   Chief Complaint  Patient presents with  . Follow-up  . Hypertension   Subjective:    HPI  Hypertension, follow-up:  BP Readings from Last 3 Encounters:  06/21/16 100/70  03/29/16 100/70  03/16/16 120/78    She was last seen for hypertension 09/23/2015.  BP at that visit was 122/80. Management since that visit includes; no changes, continue current medications.She reports good compliance with treatment. She is not having side effects. none She is exercising/walking. She is adherent to low salt diet.   Outside blood pressures are not checking. She is experiencing none.  Patient denies none.   Cardiovascular risk factors include none.  Use of agents associated with hypertension: none.   ----------------------------------------------------------------  Is out of Ambien for several months and is having trouble sleeping. States it worked well but she had weird dreams when she took. She would like to take occasionally.   No Known Allergies   Current Outpatient Prescriptions:  .  acetaminophen (TYLENOL) 500 MG tablet, Take 1,000 mg by mouth every 6 (six) hours as needed for pain. , Disp: , Rfl:  .  amLODipine (NORVASC) 10 MG tablet, TAKE ONE TABLET BY MOUTH ONCE DAILY, Disp: 30 tablet, Rfl: 0 .  ferrous sulfate 325 (65 FE) MG EC tablet, Take 1 tablet (325 mg total) by mouth 3 (three) times daily with meals., Disp: 60 tablet, Rfl: 2 .  hydrochlorothiazide (HYDRODIURIL) 25 MG tablet, Take 1 tablet (25 mg total) by mouth daily., Disp: 90 tablet, Rfl: 4 .  ibuprofen (ADVIL,MOTRIN) 200 MG tablet, Take 400 mg by mouth every 6 (six) hours as needed for pain (For headache.)., Disp: , Rfl:  .  meloxicam (MOBIC) 15 MG tablet, Take 1 tablet (15 mg total) by mouth daily. Take with food, Disp: 30 tablet, Rfl: 4 .  methocarbamol  (ROBAXIN-750) 750 MG tablet, Take 1-2 tablets (750-1,500 mg total) by mouth 4 (four) times daily., Disp: 30 tablet, Rfl: 5 .  zolpidem (AMBIEN) 5 MG tablet, Take 1 tablet (5 mg total) by mouth at bedtime as needed for sleep. (Patient not taking: Reported on 04/26/2017), Disp: 20 tablet, Rfl: 1  Review of Systems  Constitutional: Negative for appetite change, chills, fatigue and fever.  Respiratory: Negative for chest tightness and shortness of breath.   Cardiovascular: Negative for chest pain and palpitations.  Gastrointestinal: Negative for abdominal pain, nausea and vomiting.  Neurological: Negative for dizziness and weakness.    Social History  Substance Use Topics  . Smoking status: Never Smoker  . Smokeless tobacco: Never Used  . Alcohol use 0.0 oz/week     Comment: rarely   Objective:   Pulse 85   Temp 98.1 F (36.7 C) (Oral)   Resp 16   Wt 237 lb (107.5 kg)   SpO2 98%   BMI 39.74 kg/m  Vitals:   04/26/17 1653  Pulse: 85  Resp: 16  Temp: 98.1 F (36.7 C)  TempSrc: Oral  SpO2: 98%  Weight: 237 lb (107.5 kg)     Physical Exam  General Appearance:    Alert, cooperative, no distress, obese  Eyes:    PERRL, conjunctiva/corneas clear, EOM's intact       Lungs:     Clear to auscultation bilaterally, respirations unlabored  Heart:    Regular rate and rhythm  Neurologic:  Awake, alert, oriented x 3. No apparent focal neurological           defect.           Assessment & Plan:     1. Essential hypertension Well controlled.  Continue current medications.   - Lipid panel - Renal function panel  2. Morbid obesity (HCC) Diet and exercise. She is looking into bariatric surgery.   3. Insomnia, unspecified type Start back on Ambien to take no more than 3 times a week. Can try melatonin every evening.   4. Colon cancer screening She wants to wait until the first of the year for colonoscopy due to insurance.   She is anticipated follow up with Gyn in the next  couple of months for pap/pelvic and breast cancer screening.      Mila Merryonald Melynda Krzywicki, MD  [facility]

## 2017-06-07 HISTORY — PX: LAPAROSCOPIC GASTRIC SLEEVE RESECTION WITH HIATAL HERNIA REPAIR: SHX6512

## 2017-06-22 ENCOUNTER — Encounter: Payer: Self-pay | Admitting: Family Medicine

## 2017-06-26 ENCOUNTER — Encounter: Payer: 59 | Admitting: Family Medicine

## 2017-07-04 DIAGNOSIS — Z9884 Bariatric surgery status: Secondary | ICD-10-CM | POA: Insufficient documentation

## 2017-08-07 ENCOUNTER — Ambulatory Visit (INDEPENDENT_AMBULATORY_CARE_PROVIDER_SITE_OTHER): Payer: 59 | Admitting: Women's Health

## 2017-08-07 ENCOUNTER — Encounter: Payer: Self-pay | Admitting: Women's Health

## 2017-08-07 VITALS — BP 122/80 | Ht 64.0 in | Wt 204.0 lb

## 2017-08-07 DIAGNOSIS — L739 Follicular disorder, unspecified: Secondary | ICD-10-CM

## 2017-08-07 NOTE — Progress Notes (Signed)
Presents with complaint of painful bump on left labia noted 3 days ago, much smaller and less painful today. Denies vaginal irritation/discharge, urinary symptoms, abdominal pain, or fever. No history of HSV, has not been sexually active in years/ husband prostate cancer. Monthly cycle. Gastric sleeve 06/2017 has lost 60 pounds in the past 6 months, 20 pounds after sleeve.  Exam: Appears well. Abdomen soft, nontender, external genitalia within normal limits, 1 cm non indurated resolving folliculitis on left labia majora, minimal tenderness and erythema.  Resolving folliculitis  Plan: Encouraged loose clothes, reassurance given. keep scheduled annual exam

## 2017-09-12 ENCOUNTER — Encounter: Payer: Self-pay | Admitting: Women's Health

## 2019-05-28 ENCOUNTER — Telehealth: Payer: Self-pay | Admitting: Family Medicine

## 2019-05-28 DIAGNOSIS — Z20828 Contact with and (suspected) exposure to other viral communicable diseases: Secondary | ICD-10-CM

## 2019-05-28 DIAGNOSIS — Z20822 Contact with and (suspected) exposure to covid-19: Secondary | ICD-10-CM

## 2019-05-28 NOTE — Telephone Encounter (Signed)
It wouldn't be a bad idea. i'll put on order.

## 2019-05-28 NOTE — Telephone Encounter (Signed)
Pt's husband got a positive covid test today.  Wife wants to know if she should be tested.  She has been taking care of him.  She has been using a mask around him  CB#  [phone]  Con Memos

## 2019-05-28 NOTE — Telephone Encounter (Signed)
Is this the correct order for the drive up testing site?

## 2019-05-28 NOTE — Telephone Encounter (Signed)
Done

## 2019-05-29 ENCOUNTER — Other Ambulatory Visit: Payer: Self-pay | Admitting: Family Medicine

## 2019-05-29 DIAGNOSIS — Z20822 Contact with and (suspected) exposure to covid-19: Secondary | ICD-10-CM

## 2019-06-02 LAB — NOVEL CORONAVIRUS, NAA: SARS-CoV-2, NAA: NOT DETECTED

## 2019-06-19 ENCOUNTER — Telehealth: Payer: Self-pay | Admitting: Family Medicine

## 2019-06-19 NOTE — Telephone Encounter (Signed)
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  I know her husband had Covid but she said she was tested and was neg.  Can we schd something for her...  In looking back at her appts  She has not been in since 2018.  She had a PE appt schd for Oct 2018 with Dr. Caryn Section but it was cancelled and she has not been in the office in over two years.   CB#  [phone]  teri

## 2019-06-19 NOTE — Telephone Encounter (Signed)
Apt with Tawanna Sat on 07/09/2019 at 2pm   Thanks,   -Mickel Baas

## 2019-07-09 ENCOUNTER — Ambulatory Visit (INDEPENDENT_AMBULATORY_CARE_PROVIDER_SITE_OTHER): Payer: Managed Care, Other (non HMO) | Admitting: Physician Assistant

## 2019-07-09 ENCOUNTER — Encounter: Payer: Self-pay | Admitting: Physician Assistant

## 2019-07-09 ENCOUNTER — Other Ambulatory Visit: Payer: Self-pay

## 2019-07-09 VITALS — BP 146/87 | HR 73 | Temp 97.5°F | Resp 16 | Ht 66.0 in | Wt 195.0 lb

## 2019-07-09 DIAGNOSIS — Z111 Encounter for screening for respiratory tuberculosis: Secondary | ICD-10-CM

## 2019-07-09 DIAGNOSIS — Z23 Encounter for immunization: Secondary | ICD-10-CM

## 2019-07-09 DIAGNOSIS — Z0000 Encounter for general adult medical examination without abnormal findings: Secondary | ICD-10-CM | POA: Diagnosis not present

## 2019-07-09 DIAGNOSIS — Z1159 Encounter for screening for other viral diseases: Secondary | ICD-10-CM

## 2019-07-09 DIAGNOSIS — Z8619 Personal history of other infectious and parasitic diseases: Secondary | ICD-10-CM

## 2019-07-09 DIAGNOSIS — Z114 Encounter for screening for human immunodeficiency virus [HIV]: Secondary | ICD-10-CM

## 2019-07-09 DIAGNOSIS — Z020 Encounter for examination for admission to educational institution: Secondary | ICD-10-CM

## 2019-07-09 NOTE — Progress Notes (Signed)
Patient: Katrina Collier, Female    DOB: 06-17-1966, 53 y.o.   MRN: [id number] Visit Date: 07/09/2019  Today's Provider: Margaretann LovelessJennifer M Elsy Chiang, PA-C   Chief Complaint  Patient presents with  . Annual Exam   Subjective:     Annual physical exam Katrina Derbyracey Babb Steffensmeier is a 53 y.o. female who presents today for health maintenance and complete physical. She feels well. She reports exercising none. She reports she is sleeping fairly well. ----------------------------------------------------------------- [place] [facility] Maryelizabeth RowanNancy Young she is due for pap.   Review of Systems  Constitutional: Negative.   HENT: Negative.   Eyes: Negative.   Respiratory: Negative.   Cardiovascular: Negative.   Gastrointestinal: Positive for constipation.  Endocrine: Positive for cold intolerance.  Genitourinary: Positive for frequency.  Musculoskeletal: Negative.   Skin: Negative.   Allergic/Immunologic: Negative.   Neurological: Negative.   Hematological: Negative.   Psychiatric/Behavioral: Negative.     Social History      She  reports that she has never smoked. She has never used smokeless tobacco. She reports current alcohol use. She reports that she does not use drugs.       Social History   Socioeconomic History  . Marital status: Married    Spouse name: Not on file  . Number of children: Not on file  . Years of education: Not on file  . Highest education level: Not on file  Occupational History  . Not on file  Social Needs  . Financial resource strain: Not on file  . Food insecurity    Worry: Not on file    Inability: Not on file  . Transportation needs    Medical: Not on file    Non-medical: Not on file  Tobacco Use  . Smoking status: Never Smoker  . Smokeless tobacco: Never Used  Substance and Sexual Activity  . Alcohol use: Yes    Alcohol/week: 0.0 standard drinks    Comment: rarely  . Drug use: No  . Sexual activity: Yes    Partners: Male    Birth  control/protection: Surgical  Lifestyle  . Physical activity    Days per week: Not on file    Minutes per session: Not on file  . Stress: Not on file  Relationships  . Social Musicianconnections    Talks on phone: Not on file    Gets together: Not on file    Attends religious service: Not on file    Active member of club or organization: Not on file    Attends meetings of clubs or organizations: Not on file    Relationship status: Not on file  Other Topics Concern  . Not on file  Social History Narrative  . Not on file    History reviewed. No pertinent past medical history.   Patient Active Problem List   Diagnosis Date Noted  . Insomnia 04/26/2017  . Iron deficiency 09/23/2015  . Palpitations 09/23/2015  . Essential hypertension 09/23/2015  . Chest pain 05/03/2013  . Anemia 05/03/2013  . Cardiac murmur 05/03/2013  . Morbid obesity (HCC) 01/30/2013    Past Surgical History:  Procedure Laterality Date  . BREAST REDUCTION SURGERY    . CARPAL TUNNEL RELEASE    . LAPAROSCOPIC GASTRIC SLEEVE RESECTION WITH HIATAL HERNIA REPAIR  06/2017   [facility] by [facility]  . TUBAL LIGATION    . WISDOM TOOTH EXTRACTION      Family History        Family Status  Relation Name Status  . Mother  Alive  . Father  Alive  . MGM  (Not Specified)  . PGM  (Not Specified)  . Sister  (Not Specified)        Her family history includes Cancer in her mother; Heart disease in her maternal grandmother and paternal grandmother; Hypertension in her father and sister.      No Known Allergies   Current Outpatient Medications:  .  amLODipine (NORVASC) 10 MG tablet, Take 1 tablet (10 mg total) by mouth daily. (Patient not taking: Reported on 07/09/2019), Disp: 90 tablet, Rfl: 3 .  hydrochlorothiazide (HYDRODIURIL) 25 MG tablet, Take 1 tablet (25 mg total) by mouth daily. (Patient not taking: Reported on 08/07/2017), Disp: 90 tablet, Rfl: 3 .  zolpidem (AMBIEN) 5 MG tablet, Take 1 tablet (5 mg total) by  mouth at bedtime as needed for sleep. (Patient not taking: Reported on 07/09/2019), Disp: 20 tablet, Rfl: 3   Patient Care Team: Birdie Sons, MD as PCP - General (Family Medicine) Huel Cote, NP as Nurse Practitioner (Obstetrics and Gynecology)    Objective:    Vitals: BP (!) 146/87 (BP Location: Left Arm, Patient Position: Sitting, Cuff Size: Large)   Pulse 73   Temp (!) 97.5 F (36.4 C) (Other (Comment)) Comment (Src): forehead  Resp 16   Ht 5\' 6"  (1.676 m)   Wt 195 lb (88.5 kg)   BMI 31.47 kg/m    Vitals:   07/09/19 1414  BP: (!) 146/87  Pulse: 73  Resp: 16  Temp: (!) 97.5 F (36.4 C)  TempSrc: Other (Comment)  Weight: 195 lb (88.5 kg)  Height: 5\' 6"  (1.676 m)     Physical Exam Vitals signs reviewed.  Constitutional:      General: She is not in acute distress.    Appearance: Normal appearance. She is well-developed. She is obese. She is not ill-appearing or diaphoretic.  HENT:     Head: Normocephalic and atraumatic.     Right Ear: Tympanic membrane, ear canal and external ear normal.     Left Ear: Tympanic membrane, ear canal and external ear normal.     Nose: Nose normal.     Mouth/Throat:     Mouth: Mucous membranes are moist.     Pharynx: Oropharynx is clear. No oropharyngeal exudate.  Eyes:     General: No scleral icterus.       Right eye: No discharge.        Left eye: No discharge.     Extraocular Movements: Extraocular movements intact.     Conjunctiva/sclera: Conjunctivae normal.     Pupils: Pupils are equal, round, and reactive to light.  Neck:     Musculoskeletal: Normal range of motion and neck supple.     Thyroid: No thyromegaly.     Vascular: No carotid bruit or JVD.     Trachea: No tracheal deviation.  Cardiovascular:     Rate and Rhythm: Normal rate and regular rhythm.     Pulses: Normal pulses.     Heart sounds: Normal heart sounds. No murmur. No friction rub. No gallop.   Pulmonary:     Effort: Pulmonary effort is normal. No  respiratory distress.     Breath sounds: Normal breath sounds. No wheezing or rales.  Chest:     Chest wall: No tenderness.  Abdominal:     General: Abdomen is flat. Bowel sounds are normal. There is no distension.     Palpations: Abdomen is soft.  There is no mass.     Tenderness: There is no abdominal tenderness. There is no guarding or rebound.  Musculoskeletal: Normal range of motion.        General: No tenderness.     Right lower leg: No edema.     Left lower leg: No edema.  Lymphadenopathy:     Cervical: No cervical adenopathy.  Skin:    General: Skin is warm and dry.     Capillary Refill: Capillary refill takes less than 2 seconds.     Findings: No rash.  Neurological:     General: No focal deficit present.     Mental Status: She is alert and oriented to person, place, and time. Mental status is at baseline.  Psychiatric:        Mood and Affect: Mood normal.        Behavior: Behavior normal.        Thought Content: Thought content normal.        Judgment: Judgment normal.      Depression Screen PHQ 2/9 Scores 07/09/2019  PHQ - 2 Score 0       Assessment & Plan:     Routine Health Maintenance and Physical Exam  Exercise Activities and Dietary recommendations Goals   None     Immunization History  Administered Date(s) Administered  . Influenza,inj,Quad PF,6+ Mos 08/12/2014, 07/29/2015, 08/23/2018, 07/09/2019  . Rabies, IM 07/25/2012  . Rabies, intradermal 07/25/2012  . Tdap 07/09/2019    Health Maintenance  Topic Date Due  . HIV Screening  04/20/1982  . TETANUS/TDAP  04/20/1986  . COLONOSCOPY  04/20/2017  . INFLUENZA VACCINE  06/08/2019  . MAMMOGRAM  07/08/2020 (Originally 04/20/2017)  . PAP SMEAR-Modifier  07/08/2020 (Originally 01/31/2016)     Discussed health benefits of physical activity, and encouraged her to engage in regular exercise appropriate for her age and condition.    1. 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    2. Annual physical exam Normal physical exam today. Will check labs as below and f/u pending lab results. If labs are stable and WNL she will not need to have these rechecked for one year at her next annual physical exam. She is to call the office in the meantime if she has any acute issue, questions or concerns. - CBC with Differential/Platelet - Comprehensive metabolic panel - Hemoglobin A1c - Lipid panel - TSH  3. Need for influenza vaccination Flu vaccine given today without complication. Patient sat upright for 15 minutes to check for adverse reaction before being released. - Flu Vaccine QUAD 36+ mos IM  4. Need for Tdap vaccination Tdap Vaccine given to patient without complications. Patient sat for 15 minutes after administration and was tolerated well without adverse effects. - Tdap vaccine greater than or equal to 7yo IM  5. Screening for HIV without presence of risk factors Will check labs as below and f/u pending results. - HIV Antibody (routine testing w rflx)  6. Measles screening Needed for internship. Will check labs as below and f/u pending results. - Measles/Mumps/Rubella Immunity  7. Need for hepatitis B screening test Needed for internship. Will check labs as below and f/u pending results. - HBsAb Quant HBIG Assessment  8. History of varicella Needed for internship. Will check labs as below and f/u pending results. - Varicella Zoster Abs, IgG/IgM  9. Screening-pulmonary TB Needed for internship. Will check labs as below and f/u pending results. - QuantiFERON-TB Gold Plus  --------------------------------------------------------------------    Victorino Dike  Dorothy Puffer, PA-C  [facility]

## 2019-07-09 NOTE — Patient Instructions (Signed)

## 2019-07-10 LAB — LIPID PANEL
Chol/HDL Ratio: 2.4 ratio (ref 0.0–4.4)
Cholesterol, Total: 191 mg/dL (ref 100–199)
HDL: 81 mg/dL (ref >39)
LDL Chol Calc (NIH): 99 mg/dL (ref 0–99)
Triglycerides: 58 mg/dL (ref 0–149)
VLDL Cholesterol Cal: 11 mg/dL (ref 5–40)

## 2019-07-11 LAB — COMPREHENSIVE METABOLIC PANEL
ALT: 13 IU/L (ref 0–32)
AST: 18 IU/L (ref 0–40)
Albumin/Globulin Ratio: 2 (ref 1.2–2.2)
Albumin: 4.4 g/dL (ref 3.8–4.9)
Alkaline Phosphatase: 52 IU/L (ref 39–117)
BUN/Creatinine Ratio: 17 (ref 9–23)
BUN: 11 mg/dL (ref 6–24)
Bilirubin Total: <0.2 mg/dL (ref 0.0–1.2)
CO2: 21 mmol/L (ref 20–29)
Calcium: 9.4 mg/dL (ref 8.7–10.2)
Chloride: 104 mmol/L (ref 96–106)
Creatinine, Ser: 0.64 mg/dL (ref 0.57–1.00)
GFR calc Af Amer: 119 mL/min/{1.73_m2} (ref >59)
GFR calc non Af Amer: 103 mL/min/{1.73_m2} (ref >59)
Globulin, Total: 2.2 g/dL (ref 1.5–4.5)
Glucose: 78 mg/dL (ref 65–99)
Potassium: 3.9 mmol/L (ref 3.5–5.2)
Sodium: 140 mmol/L (ref 134–144)
Total Protein: 6.6 g/dL (ref 6.0–8.5)

## 2019-07-11 LAB — CBC WITH DIFFERENTIAL/PLATELET
Basophils Absolute: 0.1 10*3/uL (ref 0.0–0.2)
Basos: 1 %
EOS (ABSOLUTE): 0.2 10*3/uL (ref 0.0–0.4)
Eos: 2 %
Hematocrit: 29.3 % — ABNORMAL LOW (ref 34.0–46.6)
Hemoglobin: 8.9 g/dL — ABNORMAL LOW (ref 11.1–15.9)
Immature Grans (Abs): 0 10*3/uL (ref 0.0–0.1)
Immature Granulocytes: 0 %
Lymphocytes Absolute: 2.5 10*3/uL (ref 0.7–3.1)
Lymphs: 32 %
MCH: 21.7 pg — ABNORMAL LOW (ref 26.6–33.0)
MCHC: 30.4 g/dL — ABNORMAL LOW (ref 31.5–35.7)
MCV: 72 fL — ABNORMAL LOW (ref 79–97)
Monocytes Absolute: 0.7 10*3/uL (ref 0.1–0.9)
Monocytes: 9 %
Neutrophils Absolute: 4.2 10*3/uL (ref 1.4–7.0)
Neutrophils: 56 %
Platelets: 319 10*3/uL (ref 150–450)
RBC: 4.1 x10E6/uL (ref 3.77–5.28)
RDW: 17.1 % — ABNORMAL HIGH (ref 11.7–15.4)
WBC: 7.7 10*3/uL (ref 3.4–10.8)

## 2019-07-11 LAB — HEMOGLOBIN A1C
Est. average glucose Bld gHb Est-mCnc: 105 mg/dL
Hgb A1c MFr Bld: 5.3 % (ref 4.8–5.6)

## 2019-07-11 LAB — HIV ANTIBODY (ROUTINE TESTING W REFLEX): HIV Screen 4th Generation wRfx: NONREACTIVE

## 2019-07-11 LAB — VARICELLA ZOSTER ABS, IGG/IGM
Varicella IgM: <0.91 index (ref 0.00–0.90)
Varicella zoster IgG: 688 index (ref >165)

## 2019-07-11 LAB — HBSAB QUANT HBIG ASSESSMENT: HBsAb Quant HBIG Assessment: 32.7 m[IU]/mL

## 2019-07-11 LAB — TSH: TSH: 1.34 u[IU]/mL (ref 0.450–4.500)

## 2019-07-11 LAB — QUANTIFERON-TB GOLD PLUS
QuantiFERON Mitogen Value: >10 IU/mL
QuantiFERON Nil Value: 0.02 IU/mL
QuantiFERON TB1 Ag Value: 0.02 IU/mL
QuantiFERON TB2 Ag Value: 0.02 IU/mL
QuantiFERON-TB Gold Plus: NEGATIVE

## 2019-07-11 LAB — MEASLES/MUMPS/RUBELLA IMMUNITY
MUMPS ABS, IGG: 107 AU/mL (ref >10.9)
RUBEOLA AB, IGG: 39.8 AU/mL (ref >16.4)
Rubella Antibodies, IGG: <0.9 index — ABNORMAL LOW (ref >0.99)

## 2019-07-31 ENCOUNTER — Telehealth: Payer: Self-pay | Admitting: Family Medicine

## 2019-08-01 ENCOUNTER — Encounter: Payer: Self-pay | Admitting: Physician Assistant

## 2019-08-01 ENCOUNTER — Other Ambulatory Visit: Payer: Self-pay

## 2019-08-01 ENCOUNTER — Ambulatory Visit (INDEPENDENT_AMBULATORY_CARE_PROVIDER_SITE_OTHER): Payer: Managed Care, Other (non HMO) | Admitting: Physician Assistant

## 2019-08-01 VITALS — Resp 16 | Wt 198.4 lb

## 2019-08-01 DIAGNOSIS — Z020 Encounter for examination for admission to educational institution: Secondary | ICD-10-CM

## 2019-08-01 NOTE — Progress Notes (Signed)
Patient: Katrina Collier Female    DOB: 28-Jun-1966   53 y.o.   MRN: [id number] Visit Date: 08/01/2019  Today's Provider: Mar Daring, PA-C   No chief complaint on file.  Subjective:     HPI  Patient was here for MMR vaccine and to have her form filled out for school.   No Known Allergies   Current Outpatient Medications:  .  amLODipine (NORVASC) 10 MG tablet, Take 1 tablet (10 mg total) by mouth daily., Disp: 90 tablet, Rfl: 3 .  hydrochlorothiazide (HYDRODIURIL) 25 MG tablet, Take 1 tablet (25 mg total) by mouth daily., Disp: 90 tablet, Rfl: 3 .  zolpidem (AMBIEN) 5 MG tablet, Take 1 tablet (5 mg total) by mouth at bedtime as needed for sleep., Disp: 20 tablet, Rfl: 3  Review of Systems  Social History   Tobacco Use  . Smoking status: Never Smoker  . Smokeless tobacco: Never Used  Substance Use Topics  . Alcohol use: Yes    Alcohol/week: 0.0 standard drinks    Comment: rarely      Objective:   Resp 16   Wt 198 lb 6.4 oz (90 kg)   BMI 32.02 kg/m  Vitals:   08/01/19 1104  Resp: 16  Weight: 198 lb 6.4 oz (90 kg)  Body mass index is 32.02 kg/m.   Physical Exam   No results found for any visits on 08/01/19.     Assessment & Plan    1. Encounter for school examination We were out of MMR vaccine and unable to give. Patient advised to call the health department for vaccination.      Mar Daring, PA-C  [facility]

## 2019-08-13 ENCOUNTER — Encounter: Payer: Self-pay | Admitting: Physician Assistant

## 2019-08-15 NOTE — Progress Notes (Deleted)
       Patient: Katrina Collier Female    DOB: [birth year]   53 y.o.   MRN: [id number] Visit Date: 08/15/2019  Today's Provider: Lelon Huh, MD   No chief complaint on file.  Subjective:     Abdominal Pain Pertinent negatives include no fever, nausea or vomiting.      No Known Allergies   Current Outpatient Medications:  .  amLODipine (NORVASC) 10 MG tablet, Take 1 tablet (10 mg total) by mouth daily., Disp: 90 tablet, Rfl: 3 .  hydrochlorothiazide (HYDRODIURIL) 25 MG tablet, Take 1 tablet (25 mg total) by mouth daily., Disp: 90 tablet, Rfl: 3 .  zolpidem (AMBIEN) 5 MG tablet, Take 1 tablet (5 mg total) by mouth at bedtime as needed for sleep., Disp: 20 tablet, Rfl: 3  Review of Systems  Constitutional: Negative for appetite change, chills, fatigue and fever.  Respiratory: Negative for chest tightness and shortness of breath.   Cardiovascular: Negative for chest pain and palpitations.  Gastrointestinal: Positive for abdominal pain. Negative for nausea and vomiting.  Neurological: Negative for dizziness and weakness.    Social History   Tobacco Use  . Smoking status: Never Smoker  . Smokeless tobacco: Never Used  Substance Use Topics  . Alcohol use: Yes    Alcohol/week: 0.0 standard drinks    Comment: rarely      Objective:   There were no vitals taken for this visit. There were no vitals filed for this visit.There is no height or weight on file to calculate BMI.   Physical Exam   No results found for any visits on 08/16/19.     Assessment & Plan        Lelon Huh, MD  [facility]

## 2019-08-16 ENCOUNTER — Ambulatory Visit (INDEPENDENT_AMBULATORY_CARE_PROVIDER_SITE_OTHER): Payer: Managed Care, Other (non HMO) | Admitting: Family Medicine

## 2019-08-16 ENCOUNTER — Encounter: Payer: Self-pay | Admitting: Family Medicine

## 2019-08-16 ENCOUNTER — Other Ambulatory Visit: Payer: Self-pay

## 2019-08-16 VITALS — BP 122/83 | HR 57 | Temp 96.6°F | Resp 16 | Wt 199.8 lb

## 2019-08-16 DIAGNOSIS — R1032 Left lower quadrant pain: Secondary | ICD-10-CM | POA: Diagnosis not present

## 2019-08-16 LAB — POCT URINALYSIS DIPSTICK
Bilirubin, UA: NEGATIVE
Blood, UA: NEGATIVE
Glucose, UA: NEGATIVE
Ketones, UA: NEGATIVE
Leukocytes, UA: NEGATIVE
Nitrite, UA: NEGATIVE
Protein, UA: NEGATIVE
Spec Grav, UA: 1.015 (ref 1.010–1.025)
Urobilinogen, UA: 0.2 E.U./dL
pH, UA: 5 (ref 5.0–8.0)

## 2019-08-16 LAB — POCT URINE PREGNANCY: Preg Test, Ur: NEGATIVE

## 2019-08-16 NOTE — Patient Instructions (Signed)
.   Please review the attached list of medications and notify my office if there are any errors.   . Please bring all of your medications to every appointment so we can make sure that our medication list is the same as yours.   . It is especially important to get the annual flu vaccine this year. If you haven't had it already, please go to your pharmacy or call the office as soon as possible to schedule you flu shot.  

## 2019-08-16 NOTE — Progress Notes (Addendum)
Patient: Katrina Collier Female    DOB: 12-19-1965   53 y.o.   MRN: [id number] Visit Date: 08/16/2019  Today's Provider: [name], MD   Chief Complaint  Patient presents with  . Abdominal Pain   Subjective:     Abdominal Pain This is a new problem. The current episode started 1 to 4 weeks ago. The onset quality is sudden. The problem occurs daily. The problem has been unchanged. The pain is located in the LLQ. The pain is mild. Quality: described as a pulling sensation. The abdominal pain does not radiate. Associated symptoms include constipation. Pertinent negatives include no anorexia, arthralgias, belching, diarrhea, dysuria, fever, flatus, frequency, headaches, hematochezia, hematuria, melena, myalgias, nausea, vomiting or weight loss. The pain is aggravated by movement and certain positions. The pain is relieved by nothing. She has tried acetaminophen for the symptoms. The treatment provided no relief. Her past medical history is significant for abdominal surgery and GERD. There is no history of colon cancer, Crohn's disease, gallstones, irritable bowel syndrome, pancreatitis, PUD or ulcerative colitis.    No Known Allergies  No current outpatient medications on file.  Review of Systems  Constitutional: Negative for fever and weight loss.  Gastrointestinal: Positive for abdominal pain and constipation. Negative for anorexia, diarrhea, flatus, hematochezia, melena, nausea and vomiting.  Genitourinary: Negative for dysuria, frequency and hematuria.  Musculoskeletal: Negative for arthralgias and myalgias.  Neurological: Negative for headaches.    Social History   Tobacco Use  . Smoking status: Never Smoker  . Smokeless tobacco: Never Used  Substance Use Topics  . Alcohol use: Yes    Alcohol/week: 0.0 standard drinks    Comment: rarely      Objective:   BP 122/83   Pulse (!) 57   Temp (!) 96.6 F (35.9 C) (Oral)   Resp 16   Wt 199 lb 12.8 oz (90.6 kg)    BMI 32.25 kg/m  Vitals:   08/16/19 0836  BP: 122/83  Pulse: (!) 57  Resp: 16  Temp: (!) 96.6 F (35.9 C)  TempSrc: Oral  Weight: 199 lb 12.8 oz (90.6 kg)  Body mass index is 32.25 kg/m.   Physical Exam  General Appearance:    Obese female, alert, cooperative, in no acute distress  Eyes:    PERRL, conjunctiva/corneas clear, EOM's intact       Lungs:     Clear to auscultation bilaterally, respirations unlabored  Heart:    Bradycardic. Normal rhythm.    Abdomen:   Tender left suprapubic area. No rebound, no guarding, no masses. No CVAT     Results for orders placed or performed in visit on 08/16/19  POCT urinalysis dipstick  Result Value Ref Range   Color, UA yellow    Clarity, UA clear    Glucose, UA Negative Negative   Bilirubin, UA negative    Ketones, UA negative    Spec Grav, UA 1.015 1.010 - 1.025   Blood, UA negative    pH, UA 5.0 5.0 - 8.0   Protein, UA Negative Negative   Urobilinogen, UA 0.2 0.2 or 1.0 E.U./dL   Nitrite, UA negative    Leukocytes, UA Negative Negative   Appearance     Odor    POCT urine pregnancy  Result Value Ref Range   Preg Test, Ur Negative Negative       Assessment & Plan    1. Left lower quadrant abdominal pain  - US PELVIC COMPLETE WITH TRANSVAGINAL;  Future  2. Left lower quadrant pain  She has never had colonoscopy and anticipates having done after the first of the year. Counseled that she should see GI sooner if no other explanation is revealed on pelvic ultrasound.       Lelon Huh, MD  [facility]

## 2019-08-20 ENCOUNTER — Telehealth: Payer: Self-pay

## 2019-08-20 NOTE — Telephone Encounter (Signed)
Patient calling asking to check on the [place] appt. Can you help her with this please.  CB# is [phone]

## 2019-08-26 ENCOUNTER — Other Ambulatory Visit: Payer: Self-pay

## 2019-08-26 ENCOUNTER — Ambulatory Visit
Admission: RE | Admit: 2019-08-26 | Discharge: 2019-08-26 | Disposition: A | Discharge status: Home / Self Care | Payer: Managed Care, Other (non HMO) | Source: Ambulatory Visit | Attending: Family Medicine | Admitting: Family Medicine

## 2019-08-26 DIAGNOSIS — R1032 Left lower quadrant pain: Secondary | ICD-10-CM | POA: Insufficient documentation

## 2019-08-30 ENCOUNTER — Telehealth: Payer: Self-pay

## 2019-08-30 ENCOUNTER — Encounter: Payer: Self-pay | Admitting: Family Medicine

## 2019-08-30 DIAGNOSIS — R1032 Left lower quadrant pain: Secondary | ICD-10-CM

## 2019-08-30 NOTE — Telephone Encounter (Signed)
-----   Message from Birdie Sons, MD sent at 08/28/2019  2:41 PM EDT ----- Pelvic ultrasound is completely normal. Need to go ahead and refer to GI for LLQ pain. If pain changes or gets worse will need abdominal CT.

## 2019-08-30 NOTE — Telephone Encounter (Signed)
LMTCB

## 2019-09-02 NOTE — Telephone Encounter (Signed)
Tried calling patient. Left message to call back. 

## 2019-09-02 NOTE — Telephone Encounter (Signed)
Patient advised. Referral placed  

## 2019-09-03 NOTE — Telephone Encounter (Signed)
Per result message patient was referred to GI for further evaluation

## 2019-09-06 ENCOUNTER — Telehealth: Payer: Self-pay | Admitting: Family Medicine

## 2019-09-06 DIAGNOSIS — Z0184 Encounter for antibody response examination: Secondary | ICD-10-CM

## 2019-09-06 NOTE — Telephone Encounter (Signed)
Pt called saying she needs the blood titer for the MMR.  She got the MMR at [facility] in [place] about 3 weeks ago.  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  CB# [phone]  Con Memos

## 2019-09-06 NOTE — Telephone Encounter (Signed)
Ok to print order and leave at lab. Advise patient is ready

## 2019-09-09 NOTE — Telephone Encounter (Signed)
Lab order printed and placed up front at [address]. Left detailed message on patient mobile voice message system.

## 2019-09-10 ENCOUNTER — Encounter: Payer: Self-pay | Admitting: *Deleted

## 2019-09-14 LAB — MEASLES/MUMPS/RUBELLA IMMUNITY
MUMPS ABS, IGG: 97 AU/mL (ref >10.9)
RUBEOLA AB, IGG: >300 AU/mL (ref >16.4)
Rubella Antibodies, IGG: 17.3 index (ref >0.99)

## 2019-09-23 ENCOUNTER — Ambulatory Visit: Payer: Managed Care, Other (non HMO) | Admitting: Gastroenterology

## 2019-11-19 ENCOUNTER — Ambulatory Visit: Payer: Managed Care, Other (non HMO) | Admitting: Gastroenterology

## 2019-11-19 ENCOUNTER — Encounter: Payer: Self-pay | Admitting: *Deleted

## 2019-11-19 DIAGNOSIS — R1032 Left lower quadrant pain: Secondary | ICD-10-CM

## 2020-03-24 ENCOUNTER — Ambulatory Visit: Payer: Managed Care, Other (non HMO) | Attending: Internal Medicine

## 2020-03-24 DIAGNOSIS — Z23 Encounter for immunization: Secondary | ICD-10-CM

## 2020-03-24 NOTE — Progress Notes (Signed)
   Covid-19 Vaccination Clinic  Name:  Katrina Collier    MRN: [id number] DOB: Jul 18, 1966  03/24/2020  [name] was observed post Covid-19 immunization for 15 minutes without incident. She was provided with Vaccine Information Sheet and instruction to access the V-Safe system.   [name] was instructed to call 911 with any severe reactions post vaccine: Marland Kitchen Difficulty breathing  . Swelling of face and throat  . A fast heartbeat  . A bad rash all over body  . Dizziness and weakness   Immunizations Administered    Name Date Dose VIS Date Route   Pfizer COVID-19 Vaccine 03/24/2020 11:26 AM 0.3 mL 01/01/2019 Intramuscular   Manufacturer: [employer], [employer]   Lot: [id number]   NDC: [address]-4

## 2020-04-14 ENCOUNTER — Ambulatory Visit: Payer: Managed Care, Other (non HMO)

## 2020-04-14 ENCOUNTER — Ambulatory Visit: Payer: Managed Care, Other (non HMO) | Attending: Internal Medicine

## 2020-04-14 DIAGNOSIS — Z23 Encounter for immunization: Secondary | ICD-10-CM

## 2020-04-14 NOTE — Progress Notes (Signed)
   Covid-19 Vaccination Clinic  Name:  Katrina Collier    MRN: [id number] DOB: 12-29-1965  04/14/2020  Katrina Collier was observed post Covid-19 immunization for 15 minutes without incident. She was provided with Vaccine Information Sheet and instruction to access the V-Safe system.   Katrina Collier was instructed to call 911 with any severe reactions post vaccine: Marland Kitchen Difficulty breathing  . Swelling of face and throat  . A fast heartbeat  . A bad rash all over body  . Dizziness and weakness   Immunizations Administered    Name Date Dose VIS Date Route   Pfizer COVID-19 Vaccine 04/14/2020 12:08 PM 0.3 mL 01/01/2019 Intramuscular   Manufacturer: [employer], [employer]   Lot: [id number]   NDC: [address]-2

## 2020-06-15 ENCOUNTER — Encounter: Payer: Self-pay | Admitting: Family Medicine

## 2020-06-15 ENCOUNTER — Other Ambulatory Visit: Payer: Self-pay

## 2020-06-15 ENCOUNTER — Ambulatory Visit: Payer: Managed Care, Other (non HMO) | Admitting: Family Medicine

## 2020-06-15 VITALS — BP 160/100 | HR 66 | Temp 97.3°F | Resp 16 | Wt 225.0 lb

## 2020-06-15 DIAGNOSIS — K449 Diaphragmatic hernia without obstruction or gangrene: Secondary | ICD-10-CM | POA: Insufficient documentation

## 2020-06-15 DIAGNOSIS — I10 Essential (primary) hypertension: Secondary | ICD-10-CM

## 2020-06-15 DIAGNOSIS — K760 Fatty (change of) liver, not elsewhere classified: Secondary | ICD-10-CM

## 2020-06-15 DIAGNOSIS — E611 Iron deficiency: Secondary | ICD-10-CM

## 2020-06-15 DIAGNOSIS — D509 Iron deficiency anemia, unspecified: Secondary | ICD-10-CM

## 2020-06-15 MED ORDER — AMLODIPINE BESYLATE 5 MG PO TABS: 5.0000 mg | ORAL_TABLET | Freq: Every day | ORAL | 3 refills | Status: DC

## 2020-06-15 NOTE — Progress Notes (Signed)
I,Roshena L Chambers,acting as a scribe for [name], MD.,have documented all relevant documentation on the behalf of [name], MD,as directed by  [name], MD while in the presence of [name], MD.   Established patient visit   Patient: Katrina Collier   DOB: [birth year]   54 y.o. Female  MRN: [id number] Visit Date: 06/15/2020  Today's healthcare provider: [name], MD   Chief Complaint  Patient presents with  . Hypertension   Subjective    HPI  Hypertension, follow-up  BP Readings from Last 3 Encounters:  06/15/20 (!) 160/100  08/16/19 122/83  07/09/19 (!) 146/87   Wt Readings from Last 3 Encounters:  06/15/20 225 lb (102.1 kg)  08/16/19 199 lb 12.8 oz (90.6 kg)  08/01/19 198 lb 6.4 oz (90 kg)     She was last seen for hypertension 04/26/2017.  BP at that visit was 120/70. Management since that visit includes no changes.  She reports good compliance with treatment. She is not having side effects.  She is following a poor diet. She is not exercising. She does not smoke.  Use of agents associated with hypertension: none.   Outside blood pressures are being checked at home. Recent readings 167/99 and 150/101.  Patient reports BP readings have been elevated within the last few days. Symptoms: No chest pain No chest pressure  No palpitations No syncope  No dyspnea No orthopnea  No paroxysmal nocturnal dyspnea Yes lower extremity edema   Pertinent labs: Lab Results  Component Value Date   CHOL 191 07/09/2019   HDL 81 07/09/2019   LDLCALC 99 07/09/2019   TRIG 58 07/09/2019   CHOLHDL 2.4 07/09/2019   Lab Results  Component Value Date   NA 140 07/09/2019   K 3.9 07/09/2019   CREATININE 0.64 07/09/2019   GFRNONAA 103 07/09/2019   GFRAA 119 07/09/2019   GLUCOSE 78 07/09/2019     The 10-year ASCVD risk score Denman George DC Jr., et al., 2013) is: 1.6%    ---------------------------------------------------------------------------------------------------    Medications: No outpatient medications prior to visit.   No facility-administered medications prior to visit.    Review of Systems  Constitutional: Negative.   Respiratory: Negative.   Cardiovascular: Negative.   Musculoskeletal: Negative.   Neurological: Positive for light-headedness and headaches.     Objective    BP (!) 160/100 (BP Location: Right Arm, Cuff Size: Large)   Pulse 66   Temp (!) 97.3 F (36.3 C) (Oral)   Resp 16   Wt 225 lb (102.1 kg)   SpO2 96% Comment: room air  BMI 36.32 kg/m   Physical Exam   General: Appearance:    Obese female in no acute distress  Eyes:    PERRL, conjunctiva/corneas clear, EOM's intact       Lungs:     Clear to auscultation bilaterally, respirations unlabored  Heart:    Normal heart rate. Normal rhythm.  2/6  MS:   All extremities are intact.   Neurologic:   Awake, alert, oriented x 3. No apparent focal neurological           defect.        No results found for any visits on 06/15/20.  Assessment & Plan     1. Essential hypertension Had previously done well with amlodipine which was discontinued after improving BP with weight loss after lap band surgery. However she has put on significant weight over the last year with Covid. Will check labs and start  back on amlodipine. She is to work on losing weight.  - Comprehensive metabolic panel - TSH - amLODipine (NORVASC) 5 MG tablet; Take 1 tablet (5 mg total) by mouth daily.  Dispense: 90 tablet; Refill: 3  2. Steatosis of liver  - Comprehensive metabolic panel  3. Iron deficiency  - Ferritin  4. Iron deficiency anemia, unspecified iron deficiency anemia type  - CBC   Future Appointments  Date Time Provider Department Center  07/14/2020  3:40 PM Sherrie Mustache, Demetrios Isaacs, MD [facility]         The entirety of the information documented in the History of Present  Illness, Review of Systems and Physical Exam were personally obtained by me. Portions of this information were initially documented by the CMA and reviewed by me for thoroughness and accuracy.      [name], MD  [facility] [phone] (phone) [phone] (fax)  [facility]

## 2020-07-14 ENCOUNTER — Ambulatory Visit: Payer: Managed Care, Other (non HMO) | Admitting: Family Medicine

## 2020-07-14 NOTE — Progress Notes (Deleted)
     Established patient visit   Patient: Katrina Collier   DOB: 1966-07-30   54 y.o. Female  MRN: [id number] Visit Date: 07/14/2020  Today's healthcare provider: [name], MD   No chief complaint on file.  Subjective    HPI  Hypertension, follow-up  BP Readings from Last 3 Encounters:  06/15/20 (!) 160/100  08/16/19 122/83  07/09/19 (!) 146/87   Wt Readings from Last 3 Encounters:  06/15/20 225 lb (102.1 kg)  08/16/19 199 lb 12.8 oz (90.6 kg)  08/01/19 198 lb 6.4 oz (90 kg)     She was last seen for hypertension 06/15/2020. BP at that visit was 160/100. Management since that visit includes restarting Amlodipine 5mg  daily. Patient was also advised to work on losing weight. Labs were ordered, but patient did not complete them at the lab.   She reports {excellent/good/fair/poor:19665} compliance with treatment. She {is/is not:9024} having side effects. {document side effects if present:1} She is following a {diet:21022986} diet. She {is/is not:9024} exercising. She {does/does not:200015} smoke.  Use of agents associated with hypertension: none.   Outside blood pressures are {***enter patient reported home BP readings, or 'not being checked':1}. Symptoms: {Yes/No:20286} chest pain {Yes/No:20286} chest pressure  {Yes/No:20286} palpitations {Yes/No:20286} syncope  {Yes/No:20286} dyspnea {Yes/No:20286} orthopnea  {Yes/No:20286} paroxysmal nocturnal dyspnea {Yes/No:20286} lower extremity edema   Pertinent labs: Lab Results  Component Value Date   CHOL 191 07/09/2019   HDL 81 07/09/2019   LDLCALC 99 07/09/2019   TRIG 58 07/09/2019   CHOLHDL 2.4 07/09/2019   Lab Results  Component Value Date   NA 140 07/09/2019   K 3.9 07/09/2019   CREATININE 0.64 07/09/2019   GFRNONAA 103 07/09/2019   GFRAA 119 07/09/2019   GLUCOSE 78 07/09/2019     The 10-year ASCVD risk score 09/08/2019 DC Jr., et al., 2013) is: 2.2%    ---------------------------------------------------------------------------------------------------  {Show patient history (optional):23778::" "}   Medications: Outpatient Medications Prior to Visit  Medication Sig  . amLODipine (NORVASC) 5 MG tablet Take 1 tablet (5 mg total) by mouth daily.   No facility-administered medications prior to visit.    Review of Systems  {Heme  Chem  Endocrine  Serology  Results Review (optional):23779::" "}  Objective    There were no vitals taken for this visit. {Show previous vital signs (optional):23777::" "}  Physical Exam  ***  No results found for any visits on 07/14/20.  Assessment & Plan     ***  No follow-ups on file.      {provider attestation***:1}   09/13/20, MD  [facility] [phone] (phone) [phone] (fax)  [facility]

## 2020-07-29 ENCOUNTER — Ambulatory Visit: Payer: Managed Care, Other (non HMO) | Admitting: Physician Assistant

## 2020-08-07 ENCOUNTER — Ambulatory Visit (INDEPENDENT_AMBULATORY_CARE_PROVIDER_SITE_OTHER): Payer: Managed Care, Other (non HMO) | Admitting: Physician Assistant

## 2020-08-07 ENCOUNTER — Other Ambulatory Visit: Payer: Self-pay

## 2020-08-07 ENCOUNTER — Encounter: Payer: Self-pay | Admitting: Physician Assistant

## 2020-08-07 VITALS — BP 148/88 | HR 86 | Temp 98.7°F | Resp 16 | Ht 64.75 in | Wt 225.2 lb

## 2020-08-07 DIAGNOSIS — Z23 Encounter for immunization: Secondary | ICD-10-CM

## 2020-08-07 DIAGNOSIS — Z6837 Body mass index (BMI) 37.0-37.9, adult: Secondary | ICD-10-CM | POA: Diagnosis not present

## 2020-08-07 DIAGNOSIS — F5101 Primary insomnia: Secondary | ICD-10-CM

## 2020-08-07 MED ORDER — BELSOMRA 5 MG PO TABS: 1.0000 | ORAL_TABLET | Freq: Every evening | ORAL | 1 refills | Status: DC | PRN

## 2020-08-07 NOTE — Patient Instructions (Addendum)
Ashwaganda for stress (Goli has a gummy version)  Belsomra/Suvorexant oral tablets What is this medicine? SUVOREXANT (su-vor-EX-ant) is used to treat insomnia. This medicine helps you to fall asleep and sleep through the night. This medicine may be used for other purposes; ask your health care provider or pharmacist if you have questions. COMMON BRAND NAME(S): Belsomra What should I tell my health care provider before I take this medicine? They need to know if you have any of these conditions:  depression  drink alcohol  drug abuse or addiction  feel sleepy or have fallen asleep suddenly during the day  history of a sudden onset of muscle weakness (cataplexy)  liver disease  lung or breathing disease, like asthma or emphysema  sleep apnea  suicidal thoughts, plans, or attempt; a previous suicide attempt by you or a family member  an unusual or allergic reaction to suvorexant, other medicines, foods, dyes, or preservatives  pregnant or trying to get pregnant  breast-feeding How should I use this medicine? Take this medicine by mouth within 30 minutes of going to bed. Do not take it unless you are able to stay in bed a full night before you must be active again. Follow the directions on the prescription label. You may take this medicine with or without a food. However, this medicine may take longer to work if you take it with or right after meals. Do not take your medicine more often than directed. Do not stop taking this medicine on your own. Always follow your doctor or health care professional's advice. A special MedGuide will be given to you by the pharmacist with each prescription and refill. Be sure to read this information carefully each time. Talk to your pediatrician regarding the use of this medicine in children. Special care may be needed. Overdosage: If you think you have taken too much of this medicine contact a poison control center or emergency room at once. NOTE:  This medicine is only for you. Do not share this medicine with others. What if I miss a dose? This medicine should only be taken immediately before going to sleep. Do not take double or extra doses. What may interact with this medicine?  alcohol  antihistamines for allergy, cough, or cold  aprepitant  boceprevir  certain antibiotics like ciprofloxacin, clarithromycin, erythromycin, telithromycin  certain antivirals for HIV or AIDS  certain medicines for anxiety or sleep  certain medicines for depression like amitriptyline, fluoxetine, nefazodone, sertraline  certain medicines for fungal infections like ketoconazole, posaconazole, fluconazole, itraconazole  certain medicines for seizures like carbamazepine, phenobarbital, primidone, phenytoin  conivaptan  digoxin  diltiazem  general anesthetics like halothane, isoflurane, methoxyflurane, propofol  grapefruit juice  imatinib  medicines that relax muscles for surgery  narcotic medicines for pain  phenothiazines like chlorpromazine, mesoridazine, prochlorperazine, thioridazine  rifampin  verapamil This list may not describe all possible interactions. Give your health care provider a list of all the medicines, herbs, non-prescription drugs, or dietary supplements you use. Also tell them if you smoke, drink alcohol, or use illegal drugs. Some items may interact with your medicine. What should I watch for while using this medicine? Visit your health care professional for regular checks on your progress. Tell your health care professional if your symptoms do not start to get better or if they get worse. Avoid caffeine-containing drinks in the evening hours. After taking this medicine, you may get up out of bed and do an activity that you do not know you are doing. The  next morning, you may have no memory of this. Activities include driving a car ("sleep-driving"), making and eating food, talking on the phone, sexual activity,  and sleep-walking. Serious injuries have occurred. Call your doctor right away if you find out you have done any of these activities. Do not take this medicine if you have used alcohol that evening. Do not take it if you have taken another medicine for sleep. Do not take this medicine unless you are able to stay in bed for a full night (7 to 8 hours) and do not drive or perform other activities requiring full alertness within 8 hours of a dose. Do not drive, use machinery, or do anything that needs mental alertness the day after you take the 20 mg dose of this medicine. The use of lower doses (10 mg) may also cause driving impairment the next day. You may have a decrease in mental alertness the day after use, even if you feel that you are fully awake. Tell your doctor if you will need to perform activities requiring full alertness, such as driving, the next day. Do not stand or sit up quickly after taking this medicine, especially if you are an older patient. This reduces the risk of dizzy or fainting spells. If you or your family notice any changes in your behavior, such as new or worsening depression, thoughts of harming yourself, anxiety, other unusual or disturbing thoughts, or memory loss, call your health care professional right away. After you stop taking this medicine, you may have trouble falling asleep. This is called rebound insomnia. This problem usually goes away on its own after 1 or 2 nights. What side effects may I notice from receiving this medicine? Side effects that you should report to your doctor or health care professional as soon as possible:  allergic reactions like skin rash, itching or hives, swelling of the face, lips, or tongue  hallucinations  periods of leg weakness lasting from seconds to a few minutes  suicidal thoughts, mood changes  unable to move or speak for several minutes while going to sleep or waking up  unusual activities while not fully awake like driving,  eating, making phone calls, or sexual activity Side effects that usually do not require medical attention (report these to your doctor or health care professional if they continue or are bothersome):  daytime drowsiness  headache  nightmares or abnormal dreams  tiredness This list may not describe all possible side effects. Call your doctor for medical advice about side effects. You may report side effects to FDA at 1-800-FDA-1088. Where should I keep my medicine? Keep out of the reach of children. This medicine can be abused. Keep your medicine in a safe place to protect it from theft. Do not share this medicine with anyone. Selling or giving away this medicine is dangerous and against the law. Store at room temperature between 15 and 30 degrees C (59 and 86 degrees F). Throw away any unused medicine after the expiration date. NOTE: This sheet is a summary. It may not cover all possible information. If you have questions about this medicine, talk to your doctor, pharmacist, or health care provider.  2020 Elsevier/Gold Standard (2018-11-09 16:37:12)  10 Relaxation Techniques That Zap Stress Fast By Lattie Corns Moninger   Listen  Relax. You deserve it, it's good for you, and it takes less time than you think. You don't need a spa weekend or a retreat. Each of these stress-relieving tips can get you from OMG to  om in less than 15 minutes. 1. Meditate  A few minutes of practice per day can help ease anxiety. "Research suggests that daily meditation may alter the brain's neural pathways, making you more resilient to stress," says psychologist Lincoln Brigham, PhD, a [place] health and wellness coach. It's simple. Sit up straight with both feet on the floor. Close your eyes. Focus your attention on reciting -- out loud or silently -- a positive mantra such as "I feel at peace" or "I love myself." Place one hand on your belly to sync the mantra with your breaths. Let any distracting thoughts  float by like clouds. 2. Breathe Deeply  Take a 5-minute break and focus on your breathing. Sit up straight, eyes closed, with a hand on your belly. Slowly inhale through your nose, feeling the breath start in your abdomen and work its way to the top of your head. Reverse the process as you exhale through your mouth.  "Deep breathing counters the effects of stress by slowing the heart rate and lowering blood pressure," psychologist Kathrin Greathouse, PhD, says. She's a certified life coach in [place], [place] 3. Be Present  Slow down.  "Take 5 minutes and focus on only one behavior with awareness," Seleta Rhymes says. Notice how the air feels on your face when you're walking and how your feet feel hitting the ground. Enjoy the texture and taste of each bite of food. When you spend time in the moment and focus on your senses, you should feel less tense. 4. Reach Out  Your social network is one of your best tools for handling stress. Talk to others -- preferably face to face, or at least on the phone. Share what's going on. You can get a fresh perspective while keeping your connection strong. 5. Tune In to Your Body  Mentally scan your body to get a sense of how stress affects it each day. Lie on your back, or sit with your feet on the floor. Start at your toes and work your way up to your scalp, noticing how your body feels.  "Simply be aware of places you feel tight or loose without trying to change anything," Seleta Rhymes says. For 1 to 2 minutes, imagine each deep breath flowing to that body part. Repeat this process as you move your focus up your body, paying close attention to sensations you feel in each body part. 6. Decompress  Place a warm heat wrap around your neck and shoulders for 10 minutes. Close your eyes and relax your face, neck, upper chest, and back muscles. Remove the wrap, and use a tennis ball or foam roller to massage away tension.  "Place the ball between your back and the wall. Lean into the ball, and hold  gentle pressure for up to 15 seconds. Then move the ball to another spot, and apply pressure," says Trecia Rogers, a nurse practitioner and assistant professor at [employer] [facility] in [place]. 7. Laugh Out Loud  A good belly laugh doesn't just lighten the load mentally. It lowers cortisol, your body's stress hormone, and boosts brain chemicals called endorphins, which help your mood. Lighten up by tuning in to your favorite sitcom or video, reading the comics, or chatting with someone who makes you smile. 8. Crank Up the Tunes  Research shows that listening to soothing music can lower blood pressure, heart rate, and anxiety. "Create a playlist of songs or nature sounds (the ocean, a bubbling brook, birds chirping), and allow your  mind to focus on the different melodies, instruments, or singers in the piece," Benninger says. You also can blow off steam by rocking out to more upbeat tunes -- or singing at the top of your lungs! 9. Get Moving  You don't have to run in order to get a runner's high. All forms of exercise, including yoga and walking, can ease depression and anxiety by helping the brain release feel-good chemicals and by giving your body a chance to practice dealing with stress. You can go for a quick walk around the block, take the stairs up and down a few flights, or do some stretching exercises like head rolls and shoulder shrugs. 10. Be Grateful  Keep a gratitude journal or several (one by your bed, one in your purse, and one at work) to help you remember all the things that are good in your life.  "Being grateful for your blessings cancels out negative thoughts and worries," says Joni Emmerling, a wellness coach in [place], [place].  Use these journals to savor good experiences like a child's smile, a sunshine-filled day, and good health. Don't forget to celebrate accomplishments like mastering a new task at work or a new hobby. When you start feeling  stressed, spend a few minutes looking through your notes to remind yourself what really matters.

## 2020-08-07 NOTE — Progress Notes (Signed)
Established patient visit   Patient: Katrina Collier   DOB: 08-31-1966   54 y.o. Female  MRN: [id number] Visit Date: 08/07/2020  Today's healthcare provider: Margaretann Loveless, PA-C   Chief Complaint  Patient presents with  . Form Completion   Subjective    HPI  She here to have an appeal form to be filled out for her biometric screen.  Patient does report she is having issues with sleep. She reports she is able to fall asleep fine, but is only asleep for maybe 2 hours and she is awake. Has tried Ambien 10mg , melatonin, valerian root, sleepy time tea in the past without much success.    Patient Active Problem List   Diagnosis Date Noted  . Hiatal hernia 06/15/2020  . Status post bariatric surgery 07/04/2017  . Insomnia 04/26/2017  . Steatosis of liver 02/02/2017  . Gastroesophageal reflux disease 10/11/2016  . Iron deficiency 09/23/2015  . Palpitations 09/23/2015  . Essential hypertension 09/23/2015  . Chest pain 05/03/2013  . Anemia 05/03/2013  . Cardiac murmur 05/03/2013  . Morbid obesity (HCC) 01/30/2013   History reviewed. No pertinent past medical history.     Medications: Outpatient Medications Prior to Visit  Medication Sig  . amLODipine (NORVASC) 5 MG tablet Take 1 tablet (5 mg total) by mouth daily.   No facility-administered medications prior to visit.    Review of Systems  Constitutional: Negative.   HENT: Negative.   Eyes: Negative.   Respiratory: Negative.   Cardiovascular: Negative.   Gastrointestinal: Negative.   Endocrine: Negative.   Genitourinary: Negative.   Musculoskeletal: Positive for back pain ("arthirtis").  Skin: Negative.   Allergic/Immunologic: Negative.   Neurological: Positive for headaches ("not today"  this is something she gets off and on).  Hematological: Negative.   Psychiatric/Behavioral: Positive for agitation, decreased concentration and sleep disturbance.    Last CBC Lab Results  Component Value Date    WBC 7.7 07/09/2019   HGB 8.9 (L) 07/09/2019   HCT 29.3 (L) 07/09/2019   MCV 72 (L) 07/09/2019   MCH 21.7 (L) 07/09/2019   RDW 17.1 (H) 07/09/2019   PLT 319 07/09/2019   Last metabolic panel Lab Results  Component Value Date   GLUCOSE 78 07/09/2019   NA 140 07/09/2019   K 3.9 07/09/2019   CL 104 07/09/2019   CO2 21 07/09/2019   BUN 11 07/09/2019   CREATININE 0.64 07/09/2019   GFRNONAA 103 07/09/2019   GFRAA 119 07/09/2019   CALCIUM 9.4 07/09/2019   PROT 6.6 07/09/2019   ALBUMIN 4.4 07/09/2019   LABGLOB 2.2 07/09/2019   AGRATIO 2.0 07/09/2019   BILITOT <0.2 07/09/2019   ALKPHOS 52 07/09/2019   AST 18 07/09/2019   ALT 13 07/09/2019      Objective    BP (!) 148/88 (BP Location: Left Arm, Patient Position: Sitting, Cuff Size: Large)   Pulse 86   Temp 98.7 F (37.1 C) (Oral)   Resp 16   Ht 5' 4.75" (1.645 m)   Wt 225 lb 3.2 oz (102.2 kg)   BMI 37.77 kg/m  BP Readings from Last 3 Encounters:  08/07/20 (!) 148/88  06/15/20 (!) 160/100  08/16/19 122/83   Wt Readings from Last 3 Encounters:  08/07/20 225 lb 3.2 oz (102.2 kg)  06/15/20 225 lb (102.1 kg)  08/16/19 199 lb 12.8 oz (90.6 kg)      Physical Exam Vitals reviewed.  Constitutional:      General: She is not  in acute distress.    Appearance: Normal appearance. She is well-developed. She is obese. She is not ill-appearing or diaphoretic.  HENT:     Head: Normocephalic and atraumatic.  Cardiovascular:     Rate and Rhythm: Normal rate and regular rhythm.     Heart sounds: Normal heart sounds. No murmur heard.  No friction rub. No gallop.   Pulmonary:     Effort: Pulmonary effort is normal. No respiratory distress.     Breath sounds: Normal breath sounds. No wheezing or rales.  Musculoskeletal:     Cervical back: Normal range of motion and neck supple.  Neurological:     Mental Status: She is alert.  Psychiatric:        Mood and Affect: Mood normal.        Thought Content: Thought content normal.         No results found for any visits on 08/07/20.  Assessment & Plan     1. Class 2 severe obesity due to excess calories with serious comorbidity and body mass index (BMI) of 37.0 to 37.9 in adult [facility]) Counseled patient on healthy lifestyle modifications including dieting and exercise. Form completed for patient.  2. Primary insomnia Patient has tried other agents without success and is having issues with sleep maintenance. Will try Belsomra as below. F/U in 4 weeks.  - Suvorexant (BELSOMRA) 5 MG TABS; Take 1 tablet by mouth at bedtime as needed.  Dispense: 30 tablet; Refill: 1  3. Need for influenza vaccination Flu vaccine given today without complication. Patient sat upright for 15 minutes to check for adverse reaction before being released. - Flu Vaccine QUAD 36+ mos IM   No follow-ups on file.         Reine Just  [facility] [phone] (phone) [phone] (fax)  [facility]

## 2020-08-10 ENCOUNTER — Telehealth: Payer: Self-pay

## 2020-08-10 NOTE — Telephone Encounter (Signed)
Called patient No Answer- to inform that her appeal form is completed and ready for pick up.

## 2020-08-13 ENCOUNTER — Encounter: Payer: Self-pay | Admitting: Physician Assistant

## 2020-11-19 ENCOUNTER — Telehealth: Payer: Self-pay

## 2020-11-19 NOTE — Telephone Encounter (Signed)
Copied from CRM [id number]. Topic: General - Other >> Nov 19, 2020  2:47 PM Jaquita Rector A wrote: Reason for CRM: Patient would like a call back to schedule Covid testing please having symptoms. Can be reached at Ph# [phone]

## 2020-11-20 NOTE — Telephone Encounter (Signed)
NA, Mailbox is full.

## 2020-11-20 NOTE — Telephone Encounter (Signed)
PEC, please schedule virtual visit for patient.

## 2020-12-09 ENCOUNTER — Telehealth: Payer: Self-pay | Admitting: Physician Assistant

## 2020-12-09 NOTE — Telephone Encounter (Signed)
Pt had positive home test from [facility] on 11-18-2020 . Pt would like to know if jenni will complete disability paperwork from [employer]. Pt said [employer] will not accept positive in home test result unless jenni will complete the paper work  her [facility] test was neg on 11-26-2020. Pt was out of work jan 13,14 and 17-21 and return to work on 11-30-2020. Pt will print form out  and fax if jenni will complete. Pt also will fax covid results. Please advise. Pt last seen in jenni in oct 2021

## 2020-12-09 NOTE — Telephone Encounter (Signed)
Yes this is ok 

## 2020-12-10 NOTE — Telephone Encounter (Signed)
Patient advised and will fax over all the papers

## 2020-12-11 ENCOUNTER — Encounter: Payer: Self-pay | Admitting: Physician Assistant

## 2020-12-24 ENCOUNTER — Encounter: Payer: Self-pay | Admitting: Physician Assistant

## 2020-12-24 ENCOUNTER — Ambulatory Visit
Admission: RE | Admit: 2020-12-24 | Discharge: 2020-12-24 | Disposition: A | Discharge status: Home / Self Care | Payer: Managed Care, Other (non HMO) | Source: Ambulatory Visit | Attending: Physician Assistant | Admitting: Physician Assistant

## 2020-12-24 ENCOUNTER — Other Ambulatory Visit: Payer: Self-pay

## 2020-12-24 ENCOUNTER — Ambulatory Visit: Payer: Managed Care, Other (non HMO) | Admitting: Physician Assistant

## 2020-12-24 VITALS — BP 125/85 | HR 71 | Temp 98.3°F | Wt 230.0 lb

## 2020-12-24 DIAGNOSIS — M76892 Other specified enthesopathies of left lower limb, excluding foot: Secondary | ICD-10-CM

## 2020-12-24 MED ORDER — METHYLPREDNISOLONE 4 MG PO TBPK: ORAL_TABLET | ORAL | 0 refills | Status: DC

## 2020-12-24 NOTE — Progress Notes (Signed)
Established patient visit   Patient: Katrina Collier   DOB: 28-Jun-1966   55 y.o. Female  MRN: [id number] Visit Date: 12/24/2020  Today's healthcare provider: Margaretann Loveless, PA-C   No chief complaint on file.  Subjective    Groin Pain This is a chronic problem. The current episode started more than 1 month ago (Left groin pain started about three months ago.  The right side started a few days ago. ). The problem has been gradually worsening. The problem affects both sides. She is not pregnant. Associated symptoms include constipation. Pertinent negatives include no abdominal pain, diarrhea, dysuria, frequency, headaches, nausea or vomiting. Exacerbated by: lying down, movement.  She has tried acetaminophen for the symptoms.    She reports the left groin pain is more persistent and constant. The right groin pain just started and is very intermittent. She reports the left groin pain is worse when she lies back and rolls over. It hurts most when she is laying on her right hip. Feels like a pull to the left.   Patient Active Problem List   Diagnosis Date Noted  . Hiatal hernia 06/15/2020  . Status post bariatric surgery 07/04/2017  . Insomnia 04/26/2017  . Steatosis of liver 02/02/2017  . Gastroesophageal reflux disease 10/11/2016  . Iron deficiency 09/23/2015  . Palpitations 09/23/2015  . Essential hypertension 09/23/2015  . Chest pain 05/03/2013  . Anemia 05/03/2013  . Cardiac murmur 05/03/2013  . Morbid obesity (HCC) 01/30/2013   No past medical history on file. Social History   Tobacco Use  . Smoking status: Never Smoker  . Smokeless tobacco: Never Used  Vaping Use  . Vaping Use: Never used  Substance Use Topics  . Alcohol use: Yes    Alcohol/week: 0.0 standard drinks    Comment: rarely  . Drug use: No   No Known Allergies   Medications: Outpatient Medications Prior to Visit  Medication Sig  . amLODipine (NORVASC) 5 MG tablet Take 1 tablet (5 mg total)  by mouth daily.  . celecoxib (CELEBREX) 200 MG capsule Take by mouth.  . Suvorexant (BELSOMRA) 5 MG TABS Take 1 tablet by mouth at bedtime as needed.   No facility-administered medications prior to visit.    Review of Systems  Constitutional: Negative.   Respiratory: Negative.   Cardiovascular: Positive for leg swelling. Negative for chest pain and palpitations.  Gastrointestinal: Positive for constipation. Negative for abdominal distention, abdominal pain, anal bleeding, blood in stool, diarrhea, nausea, rectal pain and vomiting.  Genitourinary: Negative for dysuria and frequency.  Neurological: Negative for dizziness, light-headedness and headaches.        Objective    BP 125/85 (BP Location: Left Arm, Patient Position: Sitting, Cuff Size: Large)   Pulse 71   Temp 98.3 F (36.8 C) (Oral)   Wt 230 lb (104.3 kg)   BMI 38.57 kg/m    Physical Exam Vitals reviewed.  Constitutional:      General: She is not in acute distress.    Appearance: Normal appearance. She is well-developed and well-nourished. She is obese. She is not ill-appearing.  HENT:     Head: Normocephalic and atraumatic.  Eyes:     Extraocular Movements: EOM normal.  Pulmonary:     Effort: Pulmonary effort is normal. No respiratory distress.  Abdominal:     General: Abdomen is flat. Bowel sounds are normal.     Palpations: Abdomen is soft.     Tenderness: There is abdominal tenderness in the  left lower quadrant.    Musculoskeletal:     Cervical back: Normal range of motion and neck supple.     Right hip: Normal. No deformity, tenderness, bony tenderness or crepitus. Normal range of motion. Normal strength.     Left hip: Tenderness present. No deformity, bony tenderness or crepitus. Decreased range of motion (hip extension caused discomfort). Normal strength.     Right lower leg: No edema.     Left lower leg: No edema.       Legs:  Neurological:     Mental Status: She is alert.  Psychiatric:         Mood and Affect: Mood and affect normal.        Behavior: Behavior normal.        Thought Content: Thought content normal.        Judgment: Judgment normal.      No results found for any visits on 12/24/20.  Assessment & Plan     1. Hip flexor tendinitis, left Due to patient's pain and location I am expected it to be hip flexor tendinitis and possibly bursitis most likely involving the iliopsoas and/or psoas major muscles. Possible to have some rectus femoris but with pain location and trunk movements aggravate more, less likely. Will start medrol as below for inflammation. Hold celebrex. Imaging will be obtained for further evaluation and to R/O OA. Will follow up pending results. Exercises and stretches printed for patient. May benefit from formal PT program, but will see how she does with conservative treatment listed above. Call if worsening.  - methylPREDNISolone (MEDROL) 4 MG TBPK tablet; 6 day taper; take as directed on package instructions  Dispense: 21 tablet; Refill: 0 - DG Hip Unilat W OR W/O Pelvis 2-3 Views Left; Future   No follow-ups on file.      Delmer Islam, PA-C, have reviewed all documentation for this visit. The documentation on 12/24/20 for the exam, diagnosis, procedures, and orders are all accurate and complete.   Reine Just  [facility] [phone] (phone) [phone] (fax)  [facility]

## 2020-12-24 NOTE — Patient Instructions (Signed)
Hip Exercises Ask your health care provider which exercises are safe for you. Do exercises exactly as told by your health care provider and adjust them as directed. It is normal to feel mild stretching, pulling, tightness, or discomfort as you do these exercises. Stop right away if you feel sudden pain or your pain gets worse. Do not begin these exercises until told by your health care provider. Stretching and range-of-motion exercises These exercises warm up your muscles and joints and improve the movement and flexibility of your hip. These exercises also help to relieve pain, numbness, and tingling. You may be asked to limit your range of motion if you had a hip replacement. Talk to your health care provider about these restrictions. Hamstrings, supine 1. Lie on your back (supine position). 2. Loop a belt or towel over the ball of your left / right foot. The ball of your foot is on the walking surface, right under your toes. 3. Straighten your left / right knee and slowly pull on the belt or towel to raise your leg until you feel a gentle stretch behind your knee (hamstring). ? Do not let your knee bend while you do this. ? Keep your other leg flat on the floor. 4. Hold this position for __________ seconds. 5. Slowly return your leg to the starting position. Repeat __________ times. Complete this exercise __________ times a day.   Hip rotation 1. Lie on your back on a firm surface. 2. With your left / right hand, gently pull your left / right knee toward the shoulder that is on the same side of the body. Stop when your knee is pointing toward the ceiling. 3. Hold your left / right ankle with your other hand. 4. Keeping your knee steady, gently pull your left / right ankle toward your other shoulder until you feel a stretch in your buttocks. ? Keep your hips and shoulders firmly planted while you do this stretch. 5. Hold this position for __________ seconds. Repeat __________ times. Complete  this exercise __________ times a day.   Seated stretch This exercise is sometimes called hamstrings and adductors stretch. 1. Sit on the floor with your legs stretched wide. Keep your knees straight during this exercise. 2. Keeping your head and back in a straight line, bend at your waist to reach for your left foot (position A). You should feel a stretch in your right inner thigh (adductors). 3. Hold this position for __________ seconds. Then slowly return to the upright position. 4. Keeping your head and back in a straight line, bend at your waist to reach forward (position B). You should feel a stretch behind both of your thighs and knees (hamstrings). 5. Hold this position for __________ seconds. Then slowly return to the upright position. 6. Keeping your head and back in a straight line, bend at your waist to reach for your right foot (position C). You should feel a stretch in your left inner thigh (adductors). 7. Hold this position for __________ seconds. Then slowly return to the upright position. Repeat __________ times. Complete this exercise __________ times a day.   Lunge This exercise stretches the muscles of the hip (hip flexors). 1. Place your left / right knee on the floor and bend your other knee so that is directly over your ankle. You should be half-kneeling. 2. Keep good posture with your head over your shoulders. 3. Tighten your buttocks to point your tailbone downward. This will prevent your back from arching too much. 4.   You should feel a gentle stretch in the front of your left / right thigh and hip. If you do not feel a stretch, slide your other foot forward slightly and then slowly lunge forward with your chest up until your knee once again lines up over your ankle. ? Make sure your tailbone continues to point downward. 5. Hold this position for __________ seconds. 6. Slowly return to the starting position. Repeat __________ times. Complete this exercise __________ times  a day.   Strengthening exercises These exercises build strength and endurance in your hip. Endurance is the ability to use your muscles for a long time, even after they get tired. Bridge This exercise strengthens the muscles of your hip (hip extensors). 1. Lie on your back on a firm surface with your knees bent and your feet flat on the floor. 2. Tighten your buttocks muscles and lift your bottom off the floor until the trunk of your body and your hips are level with your thighs. ? Do not arch your back. ? You should feel the muscles working in your buttocks and the back of your thighs. If you do not feel these muscles, slide your feet 1-2 inches (2.5-5 cm) farther away from your buttocks. 3. Hold this position for __________ seconds. 4. Slowly lower your hips to the starting position. 5. Let your muscles relax completely between repetitions. Repeat __________ times. Complete this exercise __________ times a day.   Straight leg raises, side-lying This exercise strengthens the muscles that move the hip joint away from the center of the body (hip abductors). 1. Lie on your side with your left / right leg in the top position. Lie so your head, shoulder, hip, and knee line up. You may bend your bottom knee slightly to help you balance. 2. Roll your hips slightly forward, so your hips are stacked directly over each other and your left / right knee is facing forward. 3. Leading with your heel, lift your top leg 4-6 inches (10-15 cm). You should feel the muscles in your top hip lifting. ? Do not let your foot drift forward. ? Do not let your knee roll toward the ceiling. 4. Hold this position for __________ seconds. 5. Slowly return to the starting position. 6. Let your muscles relax completely between repetitions. Repeat __________ times. Complete this exercise __________ times a day.   Straight leg raises, side-lying This exercise strengthens the muscles that move the hip joint toward the center  of the body (hip adductors). 1. Lie on your side with your left / right leg in the bottom position. Lie so your head, shoulder, hip, and knee line up. You may place your upper foot in front to help you balance. 2. Roll your hips slightly forward, so your hips are stacked directly over each other and your left / right knee is facing forward. 3. Tense the muscles in your inner thigh and lift your bottom leg 4-6 inches (10-15 cm). 4. Hold this position for __________ seconds. 5. Slowly return to the starting position. 6. Let your muscles relax completely between repetitions. Repeat __________ times. Complete this exercise __________ times a day.   Straight leg raises, supine This exercise strengthens the muscles in the front of your thigh (quadriceps). 1. Lie on your back (supine position) with your left / right leg extended and your other knee bent. 2. Tense the muscles in the front of your left / right thigh. You should see your kneecap slide up or see increased dimpling just   above your knee. 3. Keep these muscles tight as you raise your leg 4-6 inches (10-15 cm) off the floor. Do not let your knee bend. 4. Hold this position for __________ seconds. 5. Keep these muscles tense as you lower your leg. 6. Relax the muscles slowly and completely between repetitions. Repeat __________ times. Complete this exercise __________ times a day.   Hip abductors, standing This exercise strengthens the muscles that move the leg and hip joint away from the center of the body (hip abductors). 1. Tie one end of a rubber exercise band or tubing to a secure surface, such as a chair, table, or pole. 2. Loop the other end of the band or tubing around your left / right ankle. 3. Keeping your ankle with the band or tubing directly opposite the secured end, step away until there is tension in the tubing or band. Hold on to a chair, table, or pole as needed for balance. 4. Lift your left / right leg out to your side.  While you do this: ? Keep your back upright. ? Keep your shoulders over your hips. ? Keep your toes pointing forward. ? Make sure to use your hip muscles to slowly lift your leg. Do not tip your body or forcefully lift your leg. 5. Hold this position for __________ seconds. 6. Slowly return to the starting position. Repeat __________ times. Complete this exercise __________ times a day. Squats This exercise strengthens the muscles in the front of your thigh (quadriceps). 1. Stand in a door frame so your feet and knees are in line with the frame. You may place your hands on the frame for balance. 2. Slowly bend your knees and lower your hips like you are going to sit in a chair. ? Keep your lower legs in a straight-up-and-down position. ? Do not let your hips go lower than your knees. ? Do not bend your knees lower than told by your health care provider. ? If your hip pain increases, do not bend as low. 3. Hold this position for ___________ seconds. 4. Slowly push with your legs to return to standing. Do not use your hands to pull yourself to standing. Repeat __________ times. Complete this exercise __________ times a day. This information is not intended to replace advice given to you by your health care provider. Make sure you discuss any questions you have with your health care provider. Document Revised: 05/30/2019 Document Reviewed: 09/04/2018 Elsevier Patient Education  2021 [employer]  

## 2020-12-25 ENCOUNTER — Telehealth: Payer: Self-pay | Admitting: Physician Assistant

## 2020-12-25 NOTE — Telephone Encounter (Signed)
Pt is calling back for her x-ray results. Please advise [phone]

## 2020-12-28 ENCOUNTER — Telehealth: Payer: Self-pay

## 2020-12-28 NOTE — Telephone Encounter (Signed)
Pt advised. She is going to hold off on the referral for now.  She states if the pain does not improve she will call back.  Thanks,   -Vernona Rieger

## 2020-12-28 NOTE — Telephone Encounter (Signed)
Pt advised of x-ray results.  She would like to hold off on the referral for now.  She states she will call back if her hip pain does not improved.   Thanks,   -Vernona Rieger

## 2020-12-28 NOTE — Telephone Encounter (Signed)
-----   Message from Margaretann Loveless, [place] sent at 12/28/2020  8:30 AM EST ----- [facility],  There are changes noted on the bones of the hip that are associated with tight pulling muscles and tendinitis. I would recommend orthopedic referral and/or PT.  Daiva Nakayama, PAC

## 2021-06-30 ENCOUNTER — Other Ambulatory Visit: Payer: Self-pay | Admitting: Family Medicine

## 2021-06-30 DIAGNOSIS — I10 Essential (primary) hypertension: Secondary | ICD-10-CM

## 2021-06-30 NOTE — Telephone Encounter (Signed)
Requested medications are due for refill today.  yes  Requested medications are on the active medications list.  Yes  Last refill. 06/15/2020  Future visit scheduled.   no  Notes to clinic.  PCP listed is Ms. Burnette. No future OV is scheduled. Prescription is expired.

## 2021-08-27 ENCOUNTER — Ambulatory Visit: Payer: Managed Care, Other (non HMO) | Admitting: Family Medicine

## 2021-08-27 ENCOUNTER — Other Ambulatory Visit: Payer: Self-pay

## 2021-08-27 ENCOUNTER — Encounter: Payer: Self-pay | Admitting: Family Medicine

## 2021-08-27 VITALS — BP 145/93 | HR 74 | Wt 232.0 lb

## 2021-08-27 DIAGNOSIS — I10 Essential (primary) hypertension: Secondary | ICD-10-CM | POA: Diagnosis not present

## 2021-08-27 DIAGNOSIS — Z1159 Encounter for screening for other viral diseases: Secondary | ICD-10-CM

## 2021-08-27 DIAGNOSIS — Z23 Encounter for immunization: Secondary | ICD-10-CM

## 2021-08-27 NOTE — Progress Notes (Signed)
Established patient visit   Patient: Katrina Collier   DOB: Jan 31, 1966   55 y.o. Female  MRN: [id number] Visit Date: 08/27/2021  Today's healthcare provider: [name], MD   Chief Complaint  Patient presents with   Hypertension   Insomnia   Subjective    Insomnia Primary symptoms: sleep disturbance, frequent awakening.   The problem is unchanged. How many beverages per day that contain caffeine: 2-3.  Types of beverages you drink: coffee. Typical bedtime:  11-12 P.M..  How long after going to bed to you fall asleep: 15-30 minutes.     Hypertension, follow-up  BP Readings from Last 3 Encounters:  08/27/21 (!) 145/93  12/24/20 125/85  08/07/20 (!) 148/88   Wt Readings from Last 3 Encounters:  08/27/21 232 lb (105.2 kg)  12/24/20 230 lb (104.3 kg)  08/07/20 225 lb 3.2 oz (102.2 kg)     She was last seen for hypertension 06/15/2020.  BP at that visit was 160/100. Management since that visit includes; restarted amlodipine 5 mg qd. She reports excellent compliance with treatment. Has been our for about 5 days.  She is not having side effects.  She is exercising. She is adherent to low salt diet.   Outside blood pressures are not being checked at home.  She does not smoke.  Use of agents associated with hypertension: none.   ---------------------------------------------------------------------------------------------------   Medications: Outpatient Medications Prior to Visit  Medication Sig   amLODipine (NORVASC) 5 MG tablet Take 1 tablet by mouth once daily   Suvorexant (BELSOMRA) 5 MG TABS Take 1 tablet by mouth at bedtime as needed.   [DISCONTINUED] methylPREDNISolone (MEDROL) 4 MG TBPK tablet 6 day taper; take as directed on package instructions   No facility-administered medications prior to visit.    Review of Systems  Constitutional: Negative.  Negative for appetite change, chills, fatigue and fever.  Respiratory: Negative.  Negative for chest  tightness and shortness of breath.   Cardiovascular: Negative.  Negative for chest pain and palpitations.  Gastrointestinal: Negative.  Negative for abdominal pain, nausea and vomiting.  Neurological:  Positive for headaches. Negative for dizziness, weakness and light-headedness.  Psychiatric/Behavioral:  Positive for decreased concentration and sleep disturbance. Negative for dysphoric mood, self-injury and suicidal ideas. The patient is nervous/anxious and has insomnia.       Objective    BP (!) 145/93 (BP Location: Left Arm, Patient Position: Sitting, Cuff Size: Large)   Pulse 74   Wt 232 lb (105.2 kg)   SpO2 95%   BMI 38.91 kg/m    Physical Exam   General: Appearance:    Mildly obese female in no acute distress  Eyes:    PERRL, conjunctiva/corneas clear, EOM's intact       Lungs:     Clear to auscultation bilaterally, respirations unlabored  Heart:    Normal heart rate. Normal rhythm.  1/6 systolic murmur at right upper sternal border   MS:   All extremities are intact.    Neurologic:   Awake, alert, oriented x 3. No apparent focal neurological defect.         Assessment & Plan     1. Essential hypertension Doing well with amlodipine but out of medication for the last 5 days. She has prescription waiting for her at the pharmacy.   - Comprehensive metabolic panel - Lipid panel - CBC  2. Need for influenza vaccination  - Flu Vaccine QUAD 6+ mos PF IM (Fluarix Quad PF)  3. Need for hepatitis C screening test  - Hepatitis C antibody   She was advised she is overdue for routine CPE/pap/mammogram. She states she is going to schedule this with a female provider at the start of the new year.       The entirety of the information documented in the History of Present Illness, Review of Systems and Physical Exam were personally obtained by me. Portions of this information were initially documented by the CMA and reviewed by me for thoroughness and accuracy.     [name], MD  [facility] [phone] (phone) [phone] (fax)  [facility]

## 2021-08-27 NOTE — Patient Instructions (Signed)
Please review the attached list of medications and notify my office if there are any errors.   Please schedule a complete physical and pap screening as soon as possible.   I strongly recommend a Covid bivalent (omicron) booster if it's been more than 2 months since your last covid booster or infection.

## 2021-08-30 ENCOUNTER — Other Ambulatory Visit: Payer: Self-pay | Admitting: Family Medicine

## 2021-08-30 DIAGNOSIS — I10 Essential (primary) hypertension: Secondary | ICD-10-CM

## 2021-08-31 NOTE — Telephone Encounter (Signed)
Requested medications are due for refill today old rx filled 08/26/21 for 30 day supply  Requested medications are on the active medication list yes  Last refill 08/26/21  Last visit 08/27/21  Future visit scheduled no  Notes to clinic just filled prior rx 5 days ago.  Requested Prescriptions  Pending Prescriptions Disp Refills   amLODipine (NORVASC) 5 MG tablet [Pharmacy Med Name: amLODIPine Besylate 5 MG Oral Tablet] 30 tablet 0    Sig: Take 1 tablet by mouth once daily     Cardiovascular:  Calcium Channel Blockers Failed - 08/30/2021  6:34 PM      Failed - Last BP in normal range    BP Readings from Last 1 Encounters:  08/27/21 (!) 145/93          Passed - Valid encounter within last 6 months    Recent Outpatient Visits           4 days ago Essential hypertension   [facility] Malva Limes, MD   8 months ago Hip flexor tendinitis, left   [facility] [place], [place], [place]   1 year ago Class 2 severe obesity due to excess calories with serious comorbidity and body mass index (BMI) of 37.0 to 37.9 in adult [facility])   [facility] [place], [place], [place]   1 year ago Essential hypertension   [facility] Malva Limes, MD   2 years ago Left lower quadrant abdominal pain   [facility] Malva Limes, MD

## 2021-09-01 LAB — CBC
Hematocrit: 33.9 % — ABNORMAL LOW (ref 34.0–46.6)
Hemoglobin: 10.5 g/dL — ABNORMAL LOW (ref 11.1–15.9)
MCH: 22.5 pg — ABNORMAL LOW (ref 26.6–33.0)
MCHC: 31 g/dL — ABNORMAL LOW (ref 31.5–35.7)
MCV: 73 fL — ABNORMAL LOW (ref 79–97)
Platelets: 346 10*3/uL (ref 150–450)
RBC: 4.66 x10E6/uL (ref 3.77–5.28)
RDW: 14.3 % (ref 11.7–15.4)
WBC: 7.6 10*3/uL (ref 3.4–10.8)

## 2021-09-01 LAB — COMPREHENSIVE METABOLIC PANEL
ALT: 12 IU/L (ref 0–32)
AST: 18 IU/L (ref 0–40)
Albumin/Globulin Ratio: 1.8 (ref 1.2–2.2)
Albumin: 4.4 g/dL (ref 3.8–4.9)
Alkaline Phosphatase: 86 IU/L (ref 44–121)
BUN/Creatinine Ratio: 12 (ref 9–23)
BUN: 9 mg/dL (ref 6–24)
Bilirubin Total: 0.2 mg/dL (ref 0.0–1.2)
CO2: 21 mmol/L (ref 20–29)
Calcium: 9.7 mg/dL (ref 8.7–10.2)
Chloride: 107 mmol/L — ABNORMAL HIGH (ref 96–106)
Creatinine, Ser: 0.73 mg/dL (ref 0.57–1.00)
Globulin, Total: 2.4 g/dL (ref 1.5–4.5)
Glucose: 87 mg/dL (ref 70–99)
Potassium: 4.9 mmol/L (ref 3.5–5.2)
Sodium: 146 mmol/L — ABNORMAL HIGH (ref 134–144)
Total Protein: 6.8 g/dL (ref 6.0–8.5)
eGFR: 98 mL/min/{1.73_m2} (ref >59)

## 2021-09-01 LAB — LIPID PANEL
Chol/HDL Ratio: 3.1 ratio (ref 0.0–4.4)
Cholesterol, Total: 199 mg/dL (ref 100–199)
HDL: 65 mg/dL (ref >39)
LDL Chol Calc (NIH): 119 mg/dL — ABNORMAL HIGH (ref 0–99)
Triglycerides: 84 mg/dL (ref 0–149)
VLDL Cholesterol Cal: 15 mg/dL (ref 5–40)

## 2021-09-01 LAB — HEPATITIS C ANTIBODY: Hep C Virus Ab: <0.1 s/co ratio (ref 0.0–0.9)

## 2021-09-06 ENCOUNTER — Other Ambulatory Visit: Payer: Self-pay | Admitting: *Deleted

## 2021-09-06 DIAGNOSIS — D509 Iron deficiency anemia, unspecified: Secondary | ICD-10-CM

## 2021-09-06 DIAGNOSIS — Z1211 Encounter for screening for malignant neoplasm of colon: Secondary | ICD-10-CM

## 2021-09-15 ENCOUNTER — Ambulatory Visit: Payer: Self-pay

## 2021-09-15 NOTE — Telephone Encounter (Signed)
  Pt. Started having symptoms Saturday - sinus congestion and pain, cough, low grade fever, cough, ears feel full, fatigue. No availability in the practice per [place]. Pt. Will try My Chart visit.    Reason for Disposition  Fever present > 3 days (72 hours)  Answer Assessment - Initial Assessment Questions 1. ONSET: "When did the cough begin?"      Saturday 2. SEVERITY: "How bad is the cough today?"      Moderate 3. SPUTUM: "Describe the color of your sputum" (none, dry cough; clear, white, yellow, green)     Yellow green 4. HEMOPTYSIS: "Are you coughing up any blood?" If so ask: "How much?" (flecks, streaks, tablespoons, etc.)     No 5. DIFFICULTY BREATHING: "Are you having difficulty breathing?" If Yes, ask: "How bad is it?" (e.g., mild, moderate, severe)    - MILD: No SOB at rest, mild SOB with walking, speaks normally in sentences, can lie down, no retractions, pulse < 100.    - MODERATE: SOB at rest, SOB with minimal exertion and prefers to sit, cannot lie down flat, speaks in phrases, mild retractions, audible wheezing, pulse 100-120.    - SEVERE: Very SOB at rest, speaks in single words, struggling to breathe, sitting hunched forward, retractions, pulse > 120      No 6. FEVER: "Do you have a fever?" If Yes, ask: "What is your temperature, how was it measured, and when did it start?"     Low grade 7. CARDIAC HISTORY: "Do you have any history of heart disease?" (e.g., heart attack, congestive heart failure)      No 8. LUNG HISTORY: "Do you have any history of lung disease?"  (e.g., pulmonary embolus, asthma, emphysema)     No 9. PE RISK FACTORS: "Do you have a history of blood clots?" (or: recent major surgery, recent prolonged travel, bedridden)     No 10. OTHER SYMPTOMS: "Do you have any other symptoms?" (e.g., runny nose, wheezing, chest pain)       Ears full,fatigue,cough 11. PREGNANCY: "Is there any chance you are pregnant?" "When was your last menstrual period?"        No 12. TRAVEL: "Have you traveled out of the country in the last month?" (e.g., travel history, exposures)       No  Protocols used: Cough - Acute Productive-A-AH

## 2021-09-22 ENCOUNTER — Other Ambulatory Visit: Payer: Self-pay | Admitting: Family Medicine

## 2021-09-22 DIAGNOSIS — I10 Essential (primary) hypertension: Secondary | ICD-10-CM

## 2021-09-22 MED ORDER — AMLODIPINE BESYLATE 5 MG PO TABS: 5.0000 mg | ORAL_TABLET | Freq: Every day | ORAL | 0 refills | Status: DC

## 2021-10-18 NOTE — Progress Notes (Signed)
MyChart Video Visit    Virtual Visit via Video Note   This visit type was conducted due to national recommendations for restrictions regarding the COVID-19 Pandemic (e.g. social distancing) in an effort to limit this patient's exposure and mitigate transmission in our community. This patient is at least at moderate risk for complications without adequate follow up. This format is felt to be most appropriate for this patient at this time. Physical exam was limited by quality of the video and audio technology used for the visit.   Patient location: home Provider location: [facility]  I discussed the limitations of evaluation and management by telemedicine and the availability of in person appointments. The patient expressed understanding and agreed to proceed.  Patient: Katrina Collier   DOB: November 25, 1965   55 y.o. Female  MRN: [id number] Visit Date: 10/19/2021  Today's healthcare provider: [name], MD   Chief Complaint  Patient presents with   Obesity    Subjective    Sinusitis This is a new problem. The current episode started yesterday. The problem is unchanged. Associated symptoms include congestion, coughing, ear pain, headaches, sinus pressure, sneezing, a sore throat and swollen glands. Pertinent negatives include no chills, diaphoresis or shortness of breath. Past treatments include nothing.    Obesity:   Pt would like to discuss options for weight loss.  Had gastric sleeve surgery in 2018 and lost 75 pounds down to 182. Had never tried any weight loss medications, concerned about hypertension.  Has cut back on portions and eating more vegetables the last 3 months and lost 5-10 pounds.  Now 227  Medications: Outpatient Medications Prior to Visit  Medication Sig   amLODipine (NORVASC) 5 MG tablet Take 1 tablet (5 mg total) by mouth daily.   Suvorexant (BELSOMRA) 5 MG TABS Take 1 tablet by mouth at bedtime as needed. (Patient not taking: Reported on 08/27/2021)   No  facility-administered medications prior to visit.    Review of Systems  Constitutional:  Positive for fatigue. Negative for activity change, appetite change, chills, diaphoresis, fever and unexpected weight change.  HENT:  Positive for congestion, ear pain, postnasal drip, rhinorrhea, sinus pressure, sinus pain, sneezing and sore throat. Negative for ear discharge.   Eyes: Negative.   Respiratory:  Positive for cough. Negative for apnea, choking, chest tightness, shortness of breath, wheezing and stridor.   Gastrointestinal: Negative.   Neurological:  Positive for headaches.     Objective    There were no vitals taken for this visit.   Physical Exam  Awake, alert, oriented x 3. In no apparent distress    Assessment & Plan     1. Morbid obesity (HCC) Not an ideal candidate for stimulant medications due to hypertension and agel.   Try  Semaglutide-Weight Management 0.25 MG/0.5ML SOAJ; Inject 0.25 mg into the skin once a week.  Dispense: 2 mL; Refill: 0  Advised may not be available or covered by insurance, in which case we could try Saxenda instead.   Will follow up within 3 months after starting medication.   2. Primary hypertension Doing well on current medications.   3. Viral URI Onset 1 day ago. Recommend OTC saline or fluticasone nasal spray to relief sinus inflammation and congestion. She can call back for antibiotic for sinusitis if not improving after a few days.      I discussed the assessment and treatment plan with the patient. The patient was provided an opportunity to ask questions and all were answered. The  patient agreed with the plan and demonstrated an understanding of the instructions.   The patient was advised to call back or seek an in-person evaluation if the symptoms worsen or if the condition fails to improve as anticipated.  I provided 12 minutes of non-face-to-face time during this encounter.  The entirety of the information documented in the  History of Present Illness, Review of Systems and Physical Exam were personally obtained by me. Portions of this information were initially documented by the CMA and reviewed by me for thoroughness and accuracy.    [name], MD [facility] [phone] (phone) [phone] (fax)  [facility]

## 2021-10-19 ENCOUNTER — Telehealth (INDEPENDENT_AMBULATORY_CARE_PROVIDER_SITE_OTHER): Payer: Managed Care, Other (non HMO) | Admitting: Family Medicine

## 2021-10-19 DIAGNOSIS — I10 Essential (primary) hypertension: Secondary | ICD-10-CM

## 2021-10-19 DIAGNOSIS — J069 Acute upper respiratory infection, unspecified: Secondary | ICD-10-CM | POA: Diagnosis not present

## 2021-10-19 MED ORDER — SEMAGLUTIDE-WEIGHT MANAGEMENT 0.25 MG/0.5ML ~~LOC~~ SOAJ: 0.2500 mg | SUBCUTANEOUS | 0 refills | Status: DC

## 2021-10-22 ENCOUNTER — Telehealth: Payer: Self-pay | Admitting: Physician Assistant

## 2021-10-22 ENCOUNTER — Ambulatory Visit: Payer: Self-pay | Admitting: *Deleted

## 2021-10-22 DIAGNOSIS — U071 COVID-19: Secondary | ICD-10-CM

## 2021-10-22 MED ORDER — NIRMATRELVIR/RITONAVIR (PAXLOVID)TABLET: 3.0000 | ORAL_TABLET | Freq: Two times a day (BID) | ORAL | 0 refills | Status: AC

## 2021-10-22 NOTE — Telephone Encounter (Signed)
Patient states that she had a virtual visit with Dr. Caryn Section on 12/13 and he stated that if she doesn't start to feel better then he'll prescribe her an antibiotic. Pt tested positive for COVID today via home test.  Reason for Disposition  [1] COVID-19 diagnosed by positive lab test (e.g., PCR, rapid self-test kit) AND [2] mild symptoms (e.g., cough, fever, others) AND [2] no complications or SOB  Answer Assessment - Initial Assessment Questions 1. COVID-19 DIAGNOSIS: "Who made your COVID-19 diagnosis?" "Was it confirmed by a positive lab test or self-test?" If not diagnosed by a doctor (or NP/PA), ask "Are there lots of cases (community spread) where you live?" Note: See public health department website, if unsure.     + COVID test-yesterday 2. COVID-19 EXPOSURE: "Was there any known exposure to COVID before the symptoms began?" CDC Definition of close contact: within 6 feet (2 meters) for a total of 15 minutes or more over a 24-hour period.      unknown 3. ONSET: "When did the COVID-19 symptoms start?"      12/12 afternoon 4. WORST SYMPTOM: "What is your worst symptom?" (e.g., cough, fever, shortness of breath, muscle aches)     Sinus congestion 5. COUGH: "Do you have a cough?" If Yes, ask: "How bad is the cough?"       Dry cough 6. FEVER: "Do you have a fever?" If Yes, ask: "What is your temperature, how was it measured, and when did it start?"     99- low grade 7. RESPIRATORY STATUS: "Describe your breathing?" (e.g., shortness of breath, wheezing, unable to speak)      Normal breathing 8. BETTER-SAME-WORSE: "Are you getting better, staying the same or getting worse compared to yesterday?"  If getting worse, ask, "In what way?"     Better except nasal congestion 9. HIGH RISK DISEASE: "Do you have any chronic medical problems?" (e.g., asthma, heart or lung disease, weak immune system, obesity, etc.)     High BP 10. VACCINE: "Have you had the COVID-19 vaccine?" If Yes, ask: "Which one, how  many shots, when did you get it?"       yes 11. BOOSTER: "Have you received your COVID-19 booster?" If Yes, ask: "Which one and when did you get it?"       no 12. PREGNANCY: "Is there any chance you are pregnant?" "When was your last menstrual period?"       na 13. OTHER SYMPTOMS: "Do you have any other symptoms?"  (e.g., chills, fatigue, headache, loss of smell or taste, muscle pain, sore throat)       Loss of taste, chills 14. O2 SATURATION MONITOR:  "Do you use an oxygen saturation monitor (pulse oximeter) at home?" If Yes, ask "What is your reading (oxygen level) today?" "What is your usual oxygen saturation reading?" (e.g., 95%)       no  Protocols used: Coronavirus (COVID-19) Diagnosed or Suspected-A-AH

## 2021-10-22 NOTE — Telephone Encounter (Signed)
Please advise 

## 2021-10-22 NOTE — Telephone Encounter (Signed)
Pt advised.   Thanks,   -Breane Grunwald  

## 2021-10-22 NOTE — Telephone Encounter (Signed)
Have sent prescription for paxlovid to her pharmacy. She needs to reduce amlodipine to 1/2 tablet a day while taking paxlovid.

## 2021-10-22 NOTE — Telephone Encounter (Signed)
See triage note.

## 2021-10-22 NOTE — Telephone Encounter (Signed)
Patient states that she had a virtual visit with Dr. Sherrie Mustache on 12/13 and he stated that if she doesn't start to feel better then he'll prescribe her an antibiotic. Pt tested positive for COVID today via home test.

## 2021-10-22 NOTE — Telephone Encounter (Signed)
°  Chief Complaint: + COVID Symptoms: nasal congestion, cough, low grade temp Frequency: symptoms started 12/12 Pertinent Negatives: Patient denies SOB Disposition: [] ED /[] Urgent Care (no appt availability in office) / [] Appointment(In office/virtual)/ []  [place] [facility]/ [] Home Care/ [] Refused Recommended Disposition  Additional Notes: Patient is requesting antibiotic- patient states she wants antibiotic for nasal congestion- it is not better and she believes she has sinus infection related to COVID

## 2021-10-22 NOTE — Telephone Encounter (Signed)
See other phone message, sent to provider.

## 2021-11-02 ENCOUNTER — Telehealth: Payer: Self-pay | Admitting: Physician Assistant

## 2021-11-02 NOTE — Telephone Encounter (Signed)
Pt said pharmacy is waiting on prior authorization for Ozempic said she discussed it with Dr. Sherrie Mustache on last virtual visit 12/13. Looking to have it sent to  [facility] [address], [place] - [address]  Phone: [phone] Fax:  [phone]

## 2021-11-02 NOTE — Telephone Encounter (Signed)
PA was started today. 

## 2021-11-04 ENCOUNTER — Ambulatory Visit: Payer: Managed Care, Other (non HMO) | Admitting: Gastroenterology

## 2021-11-17 MED ORDER — WEGOVY 0.25 MG/0.5ML ~~LOC~~ SOAJ: 0.2500 mg | Freq: Every day | SUBCUTANEOUS | 0 refills | Status: DC

## 2021-11-17 NOTE — Telephone Encounter (Signed)
PA for Ozempic was denied. Please advise?

## 2021-11-17 NOTE — Telephone Encounter (Signed)
Copied from [place] [id number]. Topic: General - Other >> Nov 17, 2021 10:05 AM Yvette Rack wrote: Reason for CRM: Pt called for update on PA for Ozempic. Pt requests call back. Cb# [phone]

## 2021-11-17 NOTE — Telephone Encounter (Signed)
The PA was for Ozempic. Have resent prescription for brand name Wegovy.

## 2021-11-17 NOTE — Telephone Encounter (Signed)
PA was approved. 

## 2021-11-17 NOTE — Telephone Encounter (Signed)
Received PA request for [facility]. PA started.

## 2021-11-19 ENCOUNTER — Encounter: Payer: Self-pay | Admitting: Family Medicine

## 2021-11-22 ENCOUNTER — Other Ambulatory Visit: Payer: Self-pay | Admitting: Family Medicine

## 2021-11-22 NOTE — Telephone Encounter (Signed)
Pharmacy asking to clarify instructions on prescriptions.  Requested Prescriptions  Pending Prescriptions Disp Refills   WEGOVY 0.25 MG/0.5ML SOAJ [Pharmacy Med Name: WEGOVY 0.25MG        INJ] 4 mL 0    Sig: INJECT 0.25MG  INTO THE SKIN DAILY FOR TREATMENT OF MORBID OBESITY     Endocrinology:  Diabetes - GLP-1 Receptor Agonists Failed - 11/22/2021 12:57 PM      Failed - HBA1C is between 0 and 7.9 and within 180 days    Hgb A1c MFr Bld  Date Value Ref Range Status  07/09/2019 5.3 4.8 - 5.6 % Final    Comment:             Prediabetes: 5.7 - 6.4          Diabetes: >6.4          Glycemic control for adults with diabetes: <7.0           Passed - Valid encounter within last 6 months    Recent Outpatient Visits           1 month ago Morbid obesity [facility])   [facility] Malva Limes, MD   2 months ago Essential hypertension   [facility] Malva Limes, MD   11 months ago Hip flexor tendinitis, left   [facility] [place], [place], [place]   1 year ago Class 2 severe obesity due to excess calories with serious comorbidity and body mass index (BMI) of 37.0 to 37.9 in adult [facility])   [facility] [place], [place], [place]   1 year ago Essential hypertension   [facility] Sherrie Mustache, Demetrios Isaacs, MD

## 2021-11-23 ENCOUNTER — Ambulatory Visit: Payer: Self-pay

## 2021-11-23 MED ORDER — WEGOVY 0.25 MG/0.5ML ~~LOC~~ SOAJ: 0.2500 mg | SUBCUTANEOUS | 0 refills | Status: DC

## 2021-11-23 NOTE — Telephone Encounter (Signed)
I called and spoke to Nord, CMA about clarification on Wegovy since medication is normally once weekly. She states that she will ask Dr. Sherrie Mustache about this and f/up with pharmacy. She is able to see message about clarification from pharmacy.

## 2021-11-23 NOTE — Telephone Encounter (Signed)
Once a week, have sent corrected prescription.

## 2021-11-23 NOTE — Addendum Note (Signed)
Addended by: Birdie Sons on: 11/23/2021 01:53 PM   Modules accepted: Orders

## 2021-11-23 NOTE — Telephone Encounter (Signed)
Pharmacy needs clarification. They want to know if the [facility],The prescription is supposed to be once a week or once a day. Please advise.

## 2021-12-22 ENCOUNTER — Encounter: Payer: Self-pay | Admitting: Family Medicine

## 2021-12-23 MED ORDER — WEGOVY 0.5 MG/0.5ML ~~LOC~~ SOAJ: 0.5000 mg | SUBCUTANEOUS | 0 refills | Status: DC

## 2022-01-24 ENCOUNTER — Emergency Department
Admission: EM | Admit: 2022-01-24 | Discharge: 2022-01-24 | Disposition: A | Discharge status: Home / Self Care | Payer: Managed Care, Other (non HMO) | Attending: Emergency Medicine | Admitting: Emergency Medicine

## 2022-01-24 ENCOUNTER — Other Ambulatory Visit: Payer: Self-pay

## 2022-01-24 ENCOUNTER — Ambulatory Visit: Payer: Self-pay | Admitting: *Deleted

## 2022-01-24 ENCOUNTER — Emergency Department: Payer: Managed Care, Other (non HMO)

## 2022-01-24 DIAGNOSIS — I10 Essential (primary) hypertension: Secondary | ICD-10-CM | POA: Diagnosis not present

## 2022-01-24 DIAGNOSIS — H11421 Conjunctival edema, right eye: Secondary | ICD-10-CM | POA: Diagnosis not present

## 2022-01-24 DIAGNOSIS — L03213 Periorbital cellulitis: Secondary | ICD-10-CM | POA: Diagnosis not present

## 2022-01-24 DIAGNOSIS — R220 Localized swelling, mass and lump, head: Secondary | ICD-10-CM | POA: Diagnosis present

## 2022-01-24 LAB — CBC WITH DIFFERENTIAL/PLATELET
Abs Immature Granulocytes: 0.03 10*3/uL (ref 0.00–0.07)
Basophils Absolute: 0.1 10*3/uL (ref 0.0–0.1)
Basophils Relative: 1 %
Eosinophils Absolute: 0.1 10*3/uL (ref 0.0–0.5)
Eosinophils Relative: 2 %
HCT: 39.9 % (ref 36.0–46.0)
Hemoglobin: 12.2 g/dL (ref 12.0–15.0)
Immature Granulocytes: 0 %
Lymphocytes Relative: 19 %
Lymphs Abs: 1.4 10*3/uL (ref 0.7–4.0)
MCH: 23.6 pg — ABNORMAL LOW (ref 26.0–34.0)
MCHC: 30.6 g/dL (ref 30.0–36.0)
MCV: 77.3 fL — ABNORMAL LOW (ref 80.0–100.0)
Monocytes Absolute: 0.5 10*3/uL (ref 0.1–1.0)
Monocytes Relative: 7 %
Neutro Abs: 5 10*3/uL (ref 1.7–7.7)
Neutrophils Relative %: 71 %
Platelets: 305 10*3/uL (ref 150–400)
RBC: 5.16 MIL/uL — ABNORMAL HIGH (ref 3.87–5.11)
RDW: 16.4 % — ABNORMAL HIGH (ref 11.5–15.5)
WBC: 7.1 10*3/uL (ref 4.0–10.5)
nRBC: 0 % (ref 0.0–0.2)

## 2022-01-24 LAB — COMPREHENSIVE METABOLIC PANEL
ALT: 11 U/L (ref 0–44)
AST: 15 U/L (ref 15–41)
Albumin: 4 g/dL (ref 3.5–5.0)
Alkaline Phosphatase: 67 U/L (ref 38–126)
Anion gap: 12 (ref 5–15)
BUN: 11 mg/dL (ref 6–20)
CO2: 25 mmol/L (ref 22–32)
Calcium: 9.4 mg/dL (ref 8.9–10.3)
Chloride: 103 mmol/L (ref 98–111)
Creatinine, Ser: 0.58 mg/dL (ref 0.44–1.00)
GFR, Estimated: >60 mL/min (ref >60)
Glucose, Bld: 102 mg/dL — ABNORMAL HIGH (ref 70–99)
Potassium: 4.3 mmol/L (ref 3.5–5.1)
Sodium: 140 mmol/L (ref 135–145)
Total Bilirubin: 0.5 mg/dL (ref 0.3–1.2)
Total Protein: 8 g/dL (ref 6.5–8.1)

## 2022-01-24 LAB — LACTIC ACID, PLASMA: Lactic Acid, Venous: 0.7 mmol/L (ref 0.5–1.9)

## 2022-01-24 MED ORDER — IOHEXOL 300 MG/ML  SOLN
75.0000 mL | Freq: Once | INTRAMUSCULAR | Status: AC | PRN
  Administered 2022-01-24: 75 mL via INTRAVENOUS
  Filled 2022-01-24: qty 75

## 2022-01-24 MED ORDER — AMOXICILLIN-POT CLAVULANATE 875-125 MG PO TABS
1.0000 | ORAL_TABLET | Freq: Two times a day (BID) | ORAL | 0 refills | Status: AC
Start: 2022-01-24 — End: 2022-02-03

## 2022-01-24 MED ORDER — SODIUM CHLORIDE 0.9 % IV SOLN
2.0000 g | Freq: Once | INTRAVENOUS | Status: AC
  Administered 2022-01-24: 2 g via INTRAVENOUS
  Filled 2022-01-24: qty 20

## 2022-01-24 MED ORDER — ONDANSETRON HCL 4 MG/2ML IJ SOLN
4.0000 mg | Freq: Once | INTRAMUSCULAR | Status: AC
  Administered 2022-01-24: 4 mg via INTRAVENOUS
  Filled 2022-01-24: qty 2

## 2022-01-24 MED ORDER — MORPHINE SULFATE (PF) 2 MG/ML IV SOLN
2.0000 mg | Freq: Once | INTRAVENOUS | Status: AC
  Administered 2022-01-24: 2 mg via INTRAVENOUS
  Filled 2022-01-24: qty 1

## 2022-01-24 NOTE — ED Triage Notes (Addendum)
Patient to ER via POV with complaints of right eye swelling and redness that started yesterday. Denies injury/ cut/ wound but patient unable to open eye due to significant swelling. Patient reports some clear drainage. Reports two days ago did have some sinus drainage. ? ?Patient also reports generalized weakness and fatigue, reports fever last night. Patient reports headache and difficulty holding her head up. Daughter reports some periods of confusion.  ?

## 2022-01-24 NOTE — ED Notes (Signed)
See triage note. Denies R eye pain currently; states 8/10 HA. Pt in with HA and L eye swelling; states has been draining yellow/clear.  ?

## 2022-01-24 NOTE — Telephone Encounter (Signed)
?  Chief Complaint: requesting appt for eye swelling and headache  ?Symptoms: severe headache in back of head , head feels heavy and difficulty to sit up . Eye swollen on top lid and swollen shut this am with drainage and crusting on eye.  ?Frequency: started yesterday and headache last night  ?Pertinent Negatives: Patient denies dizziness, lightheadedness, no chest pain difficulty breathing . No visual disturbances , speech clear. No weakness on either side of body reported. ?Disposition: [x] ED /[] Urgent Care (no appt availability in office) / [] Appointment(In office/virtual)/ []  [place] [facility]/ [] Home Care/ [] Refused Recommended Disposition /[] [place] [facility]/ []  Follow-up with PCP ?Additional Notes:  ? ?Recommended ED and if patient can not get up with out holding on to something call 911. ? ? Reason for Disposition ? [1] SEVERE headache AND [2] sudden-onset (i.e., reaching maximum intensity within seconds to 1 hour) ? ?Answer Assessment - Initial Assessment Questions ?1. LOCATION: "Where does it hurt?"  ?    Back of head  and feels heavy when trying to sit up  ?2. ONSET: "When did the headache start?" (Minutes, hours or days)  ?    Yesterday  ?3. PATTERN: "Does the pain come and go, or has it been constant since it started?" ?    Na  ?4. SEVERITY: "How bad is the pain?" and "What does it keep you from doing?"  (e.g., Scale 1-10; mild, moderate, or severe) ?  - MILD (1-3): doesn't interfere with normal activities  ?  - MODERATE (4-7): interferes with normal activities or awakens from sleep  ?  - SEVERE (8-10): excruciating pain, unable to do any normal activities    ?    Moderate to severe ?5. RECURRENT SYMPTOM: "Have you ever had headaches before?" If Yes, ask: "When was the last time?" and "What happened that time?"  ?    No  ?6. CAUSE: "What do you think is causing the headache?" ?    Not sure has possible "pink eye" eye swollen shut and draining this am  ?7. MIGRAINE: "Have you been diagnosed  with migraine headaches?" If Yes, ask: "Is this headache similar?"  ?    na ?8. HEAD INJURY: "Has there been any recent injury to the head?"  ?    na ?9. OTHER SYMPTOMS: "Do you have any other symptoms?" (fever, stiff neck, eye pain, sore throat, cold symptoms) ?    Eye pain , swelling on top lid ?10. PREGNANCY: "Is there any chance you are pregnant?" "When was your last menstrual period?" ?      na ? ?Protocols used: Headache-A-AH ? ?

## 2022-01-24 NOTE — ED Notes (Signed)
Pt reports granddaughter had conjunctivitis a week ago. Denies allergies; denies injury to eye. Reports took a penicillin yesterday and used some eye drops. Pt can open R eye; white of eye is dark red.  ?

## 2022-01-24 NOTE — ED Provider Notes (Signed)
? ?[facility] ?Provider Note ? ? ? Event Date/Time  ? First MD Initiated Contact with Patient 01/24/22 1222   ?  (approximate) ? ? ?History  ? ?Facial Swelling ? ? ?HPI ? ?Katrina Collier is a 56 y.o. female with a history of iron deficiency anemia, hypertension, GERD who presents with complaints of right eye swelling.  Patient reports of last 24 hours she developed worsening swelling in the right eye.  She initially had redness and some discharge from her right eye, she thought it was a conjunctivitis however this morning she woke up and her eye was swollen shut.  She does describe a headache as well primarily on the left side of her head and worse when she lies down.  No fevers reported ?  ? ? ?Physical Exam  ? ?Triage Vital Signs: ?ED Triage Vitals  ?Enc Vitals Group  ?   BP 01/24/22 1208 (!) 155/88  ?   Pulse Rate 01/24/22 1208 80  ?   Resp 01/24/22 1208 15  ?   Temp 01/24/22 1208 98.5 ?F (36.9 ?C)  ?   Temp Source 01/24/22 1208 Oral  ?   SpO2 01/24/22 1208 95 %  ?   Weight 01/24/22 1208 97.1 kg (214 lb)  ?   Height 01/24/22 1208 1.651 m (5\' 5" )  ?   Head Circumference --   ?   Peak Flow --   ?   Pain Score 01/24/22 1213 9  ?   Pain Loc --   ?   Pain Edu? --   ?   Excl. in [place]? --   ? ? ?Most recent vital signs: ?Vitals:  ? 01/24/22 1329 01/24/22 1400  ?BP: (!) 144/81   ?Pulse: 70 76  ?Resp: 16   ?Temp:    ?SpO2: 98% 99%  ? ? ? ?General: Awake, no distress.  ?CV:  Good peripheral perfusion.  ?Resp:  Normal effort.  ?Abd:  No distention.  ?Other:  Right eye is swollen shut with significant eyelid edema.  Conjunctival edema with erythema noted, pupil reactive,  ? ? ?ED Results / Procedures / Treatments  ? ?Labs ?(all labs ordered are listed, but only abnormal results are displayed) ?Labs Reviewed  ?COMPREHENSIVE METABOLIC PANEL - Abnormal; Notable for the following components:  ?    Result Value  ? Glucose, Bld 102 (*)   ? All other components within normal limits  ?CBC WITH  DIFFERENTIAL/PLATELET - Abnormal; Notable for the following components:  ? RBC 5.16 (*)   ? MCV 77.3 (*)   ? MCH 23.6 (*)   ? RDW 16.4 (*)   ? All other components within normal limits  ?LACTIC ACID, PLASMA  ?LACTIC ACID, PLASMA  ?URINALYSIS, ROUTINE W REFLEX MICROSCOPIC  ? ? ? ?EKG ? ? ? ? ?RADIOLOGY ?CT head and CT orbit is consistent with preseptal cellulitis ? ? ? ?PROCEDURES: ? ?Critical Care performed:  ? ?Procedures ? ? ?MEDICATIONS ORDERED IN ED: ?Medications  ?morphine (PF) 2 MG/ML injection 2 mg (2 mg Intravenous Given 01/24/22 1235)  ?ondansetron [facility]) injection 4 mg (4 mg Intravenous Given 01/24/22 1234)  ?iohexol (OMNIPAQUE) 300 MG/ML solution 75 mL (75 mLs Intravenous Contrast Given 01/24/22 1327)  ?cefTRIAXone (ROCEPHIN) 2 g in sodium chloride 0.9 % 100 mL IVPB (2 g Intravenous New Bag/Given 01/24/22 1434)  ? ? ? ?IMPRESSION / MDM / ASSESSMENT AND PLAN / ED COURSE  ?I reviewed the triage vital signs and the nursing notes. ? ?Patient  presents with swelling of the right eye with erythema as noted above.  Differential includes a severe conjunctivitis, orbital cellulitis, preseptal cellulitis ? ?We will obtain labs including lactic acid, CT orbits CT head given headache ? ?Lab work is quite reassuring, normal lactic acid, normal white blood cell count.  Normal CT head.  CT orbit is consistent with preseptal cellulitis. ? ?We will treat with a dose of IV Rocephin here. ? ?Considered admission however given reassuring labs, afebrile and reassuring CT appropriate for outpatient treatment with Augmentin. ? ?Discussed strict return precautions with patient and her daughter as well as outpatient follow-up.  They agree with this plan. ? ? ? ?  ? ? ?FINAL CLINICAL IMPRESSION(S) / ED DIAGNOSES  ? ?Final diagnoses:  ?Preseptal cellulitis of right eye  ? ? ? ?Rx / DC Orders  ? ?ED Discharge Orders   ? ?      Ordered  ?  amoxicillin-clavulanate (AUGMENTIN) 875-125 MG tablet  2 times daily       ? 01/24/22 1428  ? ?  ?   ? ?  ? ? ? ?Note:  This document was prepared using Dragon voice recognition software and may include unintentional dictation errors. ?  ?Lavonia Drafts, MD ?01/24/22 1510 ? ?

## 2022-01-26 ENCOUNTER — Encounter: Payer: Self-pay | Admitting: Family Medicine

## 2022-01-30 MED ORDER — WEGOVY 1 MG/0.5ML ~~LOC~~ SOAJ: 1.0000 mg | SUBCUTANEOUS | 0 refills | Status: DC

## 2022-02-22 ENCOUNTER — Ambulatory Visit (INDEPENDENT_AMBULATORY_CARE_PROVIDER_SITE_OTHER): Payer: Managed Care, Other (non HMO) | Admitting: Family Medicine

## 2022-02-22 ENCOUNTER — Encounter: Payer: Self-pay | Admitting: Family Medicine

## 2022-02-22 ENCOUNTER — Ambulatory Visit: Payer: Managed Care, Other (non HMO) | Admitting: Family Medicine

## 2022-02-22 DIAGNOSIS — I10 Essential (primary) hypertension: Secondary | ICD-10-CM | POA: Diagnosis not present

## 2022-02-22 MED ORDER — WEGOVY 1.7 MG/0.75ML ~~LOC~~ SOAJ: 1.7000 mg | SUBCUTANEOUS | 0 refills | Status: DC

## 2022-02-22 MED ORDER — AMLODIPINE BESYLATE 5 MG PO TABS: 5.0000 mg | ORAL_TABLET | Freq: Every day | ORAL | 3 refills | Status: DC

## 2022-02-22 NOTE — Progress Notes (Signed)
?  ? ?I,Jana Robinson,acting as a scribe for [name], MD.,have documented all relevant documentation on the behalf of [name], MD,as directed by  [name], MD while in the presence of [name], MD. ? ? ?Established patient visit ? ? ?Patient: Katrina Collier   DOB: 1966/04/17   56 y.o. Female  MRN: [id number] ?Visit Date: 02/22/2022 ? ?Today's healthcare provider: [name], MD  ? ?Chief Complaint  ?Patient presents with  ? Hypertension  ? Obesity  ? ?Subjective  ?  ?HPI  ?Follow up for weight management ? ?The patient was last seen for this 4 months ago. ?Changes made at last visit include Semaglutide 0.25 MG/0.5ML SOAJ; Inject 0.25 mg into the skin once a week. . ? ?She reports excellent compliance with treatment. ?She feels that condition is Improved. ?She is not having side effects.  ? ?-----------------------------------------------------------------------------------------  ?Hypertension, follow-up ? ?BP Readings from Last 3 Encounters:  ?02/22/22 (!) 142/85  ?01/24/22 138/79  ?08/27/21 (!) 145/93  ? Wt Readings from Last 3 Encounters:  ?02/22/22 211 lb 12.8 oz (96.1 kg)  ?01/24/22 214 lb (97.1 kg)  ?08/27/21 232 lb (105.2 kg)  ?  ? ?She was last seen for hypertension 6 months ago.  ?BP at that visit was 145 93. Management since that visit includes no changes. ? ?She reports excellent compliance with treatment. ?She is not having side effects. ?She is following a Regular diet. ?She is exercising. ?She does not smoke. ? ?Use of agents associated with hypertension: none.  ? ?Outside blood pressures are: does not take  ?Symptoms: ?No chest pain No chest pressure  ?No palpitations No syncope  ?No dyspnea No orthopnea  ?No paroxysmal nocturnal dyspnea Yes lower extremity edema  ? ?Pertinent labs ?Lab Results  ?Component Value Date  ? CHOL 199 08/27/2021  ? HDL 65 08/27/2021  ? LDLCALC 119 (H) 08/27/2021  ? TRIG 84 08/27/2021  ? CHOLHDL 3.1 08/27/2021  ? Lab Results  ?Component Value Date   ? NA 140 01/24/2022  ? K 4.3 01/24/2022  ? CREATININE 0.58 01/24/2022  ? GFRNONAA >60 01/24/2022  ? GLUCOSE 102 (H) 01/24/2022  ? TSH 1.340 07/09/2019  ?  ? ?The 10-year ASCVD risk score (Arnett DK, et al., 2019) is: 2.5% ? ?---------------------------------------------------------------------------------------------------  ? ? ?Medications: ?Outpatient Medications Prior to Visit  ?Medication Sig  ? amLODipine (NORVASC) 5 MG tablet Take 1 tablet (5 mg total) by mouth daily.  ? Semaglutide-Weight Management (WEGOVY) 1 MG/0.5ML SOAJ Inject 1 mg into the skin once a week.  ? ?No facility-administered medications prior to visit.  ? ? ?Review of Systems ? ? ?  Objective  ?  ?BP (!) 142/85 (BP Location: Left Arm, Patient Position: Sitting, Cuff Size: Normal)   Pulse 94   Temp 98.4 ?F (36.9 ?C) (Oral)   Resp 16   Wt 211 lb 12.8 oz (96.1 kg)   LMP 12/02/2020   SpO2 99%   BMI 35.25 kg/m?  ? ? ?Physical Exam  ?General appearance: Obese female, cooperative and in no acute distress ?Head: Normocephalic, without obvious abnormality, atraumatic ?Respiratory: Respirations even and unlabored, normal respiratory rate ?Extremities: All extremities are intact.  ?Skin: Skin color, texture, turgor normal. No rashes seen  ?Psych: Appropriate mood and affect. ?Neurologic: Mental status: Alert, oriented to person, place, and time, thought content appropriate.  ? ? Assessment & Plan  ?  ? ?1. Morbid obesity (HCC) ?Doing very well , titrated up to 1mg  Wegovy which she just took  last dose of. Having no adverse effects. Will titrate up to  Semaglutide-Weight Management (WEGOVY) 1.7 MG/0.75ML SOAJ; Inject 1.7 mg into the skin once a week.  Dispense: 3 mL; Refill: 0 ? ?Will contact patient in 3-4 weeks and titrated up to 2.4 if still tolerating well.  ? ?2. Essential hypertension ?Stable, refill amLODipine (NORVASC) 5 MG tablet; Take 1 tablet (5 mg total) by mouth daily.  Dispense: 90 tablet; Refill: 3  ? ?Expect more BP improvement if  she is able to continue losing weight.  ? ? ?   ? ?The entirety of the information documented in the History of Present Illness, Review of Systems and Physical Exam were personally obtained by me. Portions of this information were initially documented by the CMA and reviewed by me for thoroughness and accuracy.   ? ? ?[name], MD  ?[facility] ?[phone] (phone) ?[phone] (fax) ? ?[facility] ?

## 2022-02-25 ENCOUNTER — Telehealth: Payer: Managed Care, Other (non HMO) | Admitting: Family Medicine

## 2022-03-07 ENCOUNTER — Ambulatory Visit: Payer: Managed Care, Other (non HMO) | Admitting: Family Medicine

## 2022-03-18 ENCOUNTER — Encounter: Payer: Self-pay | Admitting: Family Medicine

## 2022-03-18 MED ORDER — WEGOVY 2.4 MG/0.75ML ~~LOC~~ SOAJ: 2.4000 mg | SUBCUTANEOUS | 0 refills | Status: DC

## 2022-03-21 ENCOUNTER — Telehealth (INDEPENDENT_AMBULATORY_CARE_PROVIDER_SITE_OTHER): Payer: Managed Care, Other (non HMO) | Admitting: Family Medicine

## 2022-03-21 DIAGNOSIS — F418 Other specified anxiety disorders: Secondary | ICD-10-CM | POA: Diagnosis not present

## 2022-03-21 MED ORDER — VENLAFAXINE HCL ER 37.5 MG PO CP24: 37.5000 mg | ORAL_CAPSULE | Freq: Every day | ORAL | 1 refills | Status: DC

## 2022-03-21 NOTE — Progress Notes (Signed)
? ? ?MyChart Video Visit ? ? ? ?Virtual Visit via Video Note  ? ?This visit type was conducted due to national recommendations for restrictions regarding the COVID-19 Pandemic (e.g. social distancing) in an effort to limit this patient's exposure and mitigate transmission in our community. This patient is at least at moderate risk for complications without adequate follow up. This format is felt to be most appropriate for this patient at this time. Physical exam was limited by quality of the video and audio technology used for the visit.  ? ?Patient location: home ?Provider location: [facility] ? ?I discussed the limitations of evaluation and management by telemedicine and the availability of in person appointments. The patient expressed understanding and agreed to proceed. ? ?Patient: Katrina Collier   DOB: 1966-05-18   56 y.o. Female  MRN: [id number] ?Visit Date: 03/21/2022 ? ?Today's healthcare provider: Lelon Huh, MD  ? ?Chief Complaint  ?Patient presents with  ? Stress  ? ?Subjective  ?  ?HPI  ?Stress: ?Patient complains of increased stress level due to home stress which is impairing ability to work. And endangering her job. Associated symptoms includes difficulty concentrating, insomnia and irritability. Has been walking more for exercise, but still having trouble sleeping through the night.  ? ?Admits to drinking several cups of coffee every day.  ? ? ?  03/21/2022  ?  4:10 PM 08/27/2021  ?  8:27 AM 12/24/2020  ? 11:31 AM  ?Depression screen PHQ 2/9  ?Decreased Interest 0 0 0  ?Down, Depressed, Hopeless 0 0 0  ?PHQ - 2 Score 0 0 0  ?Altered sleeping 3 3 3   ?Tired, decreased energy 3 3 2   ?Change in appetite 3 2 2   ?Feeling bad or failure about yourself  0 0 0  ?Trouble concentrating 3 0 0  ?Moving slowly or fidgety/restless 0 0 0  ?Suicidal thoughts 0 0 0  ?PHQ-9 Score 12 8 7   ?Difficult doing work/chores Somewhat difficult Not difficult at all Not difficult at all  ?  ? ? ?  03/21/2022  ?  4:14 PM  ?GAD 7 :  Generalized Anxiety Score  ?Nervous, Anxious, on Edge 3  ?Control/stop worrying 3  ?Worry too much - different things 3  ?Trouble relaxing 2  ?Restless 0  ?Easily annoyed or irritable 3  ?Afraid - awful might happen 0  ?Total GAD 7 Score 14  ?Anxiety Difficulty Very difficult  ? ?  ? ? ?Medications: ?Outpatient Medications Prior to Visit  ?Medication Sig  ? amLODipine (NORVASC) 5 MG tablet Take 1 tablet (5 mg total) by mouth daily.  ? Misc Natural Products (COLON HERBAL CLEANSER) CAPS Take by mouth.  ? Semaglutide-Weight Management (WEGOVY) 2.4 MG/0.75ML SOAJ Inject 2.4 mg into the skin once a week.  ? ?No facility-administered medications prior to visit.  ? ? ?Review of Systems  ?Constitutional:  Negative for appetite change, chills, fatigue and fever.  ?Respiratory:  Negative for chest tightness and shortness of breath.   ?Cardiovascular:  Negative for chest pain and palpitations.  ?Gastrointestinal:  Negative for abdominal pain, nausea and vomiting.  ?Neurological:  Negative for dizziness and weakness.  ?Psychiatric/Behavioral:  Positive for sleep disturbance (trouble staying asleep).   ? ? ? ? Objective  ?  ?LMP 12/02/2020  ? ? ? ? ?Physical Exam  ? ?Awake, alert, oriented x 3. In no apparent distress  ? Assessment & Plan  ?  ? ?1. Situational anxiety ?Causing problems with work, concentration and completing tasks. Will try  venlafaxine XR (EFFEXOR XR) 37.5 MG 24 hr capsule; Take 1 capsule (37.5 mg total) by mouth daily with breakfast.  Advised she can increase to 2 capsules daily in 1-2 weeks. Advised to schedule follow up in about 3 weeks to check on BP and effectiveness of medication.  ?  ? ?I discussed the assessment and treatment plan with the patient. The patient was provided an opportunity to ask questions and all were answered. The patient agreed with the plan and demonstrated an understanding of the instructions. ?  ?The patient was advised to call back or seek an in-person evaluation if the symptoms  worsen or if the condition fails to improve as anticipated. ? ?I provided 10 minutes of non-face-to-face time during this encounter. ? ?The entirety of the information documented in the History of Present Illness, Review of Systems and Physical Exam were personally obtained by me. Portions of this information were initially documented by the CMA and reviewed by me for thoroughness and accuracy.   ? ?Lelon Huh, MD ?[facility] ?[phone] (phone) ?[phone] (fax) ? ?[facility]   ?

## 2022-03-21 NOTE — Patient Instructions (Addendum)
Please review the attached list of medications and notify my office if there are any errors.  ? ?Start venlafaxine by taking 1 tablet every morning. You can increase to 2 tablets every after 1-2 weeks as long as you are tolerating the medication well.  ?

## 2022-04-21 ENCOUNTER — Other Ambulatory Visit: Payer: Self-pay | Admitting: Family Medicine

## 2022-04-24 MED ORDER — WEGOVY 2.4 MG/0.75ML ~~LOC~~ SOAJ: 2.4000 mg | SUBCUTANEOUS | 0 refills | Status: DC

## 2022-05-19 ENCOUNTER — Other Ambulatory Visit: Payer: Self-pay | Admitting: Family Medicine

## 2022-05-24 ENCOUNTER — Other Ambulatory Visit: Payer: Self-pay | Admitting: Family Medicine

## 2022-05-24 MED ORDER — WEGOVY 2.4 MG/0.75ML ~~LOC~~ SOAJ: 2.4000 mg | SUBCUTANEOUS | 3 refills | Status: DC

## 2022-06-22 ENCOUNTER — Encounter: Payer: Self-pay | Admitting: Family Medicine

## 2022-07-04 ENCOUNTER — Other Ambulatory Visit: Payer: Self-pay

## 2022-07-04 ENCOUNTER — Other Ambulatory Visit: Payer: Self-pay | Admitting: Family Medicine

## 2022-07-04 ENCOUNTER — Encounter: Payer: Self-pay | Admitting: Family Medicine

## 2022-07-04 DIAGNOSIS — I10 Essential (primary) hypertension: Secondary | ICD-10-CM

## 2022-07-04 MED ORDER — WEGOVY 2.4 MG/0.75ML ~~LOC~~ SOAJ: 2.4000 mg | SUBCUTANEOUS | 0 refills | Status: DC

## 2022-07-04 MED ORDER — AMLODIPINE BESYLATE 5 MG PO TABS: 5.0000 mg | ORAL_TABLET | Freq: Every day | ORAL | 1 refills | Status: DC

## 2022-07-05 NOTE — Telephone Encounter (Signed)
Prior authorization initiated via covermymeds. Awaiting response from insurance on coverage determination.   Per covermymeds: Typically an electronic response will be received within 24-72 hours.

## 2022-07-22 ENCOUNTER — Encounter: Payer: Self-pay | Admitting: Family Medicine

## 2022-07-22 ENCOUNTER — Ambulatory Visit (INDEPENDENT_AMBULATORY_CARE_PROVIDER_SITE_OTHER): Payer: Managed Care, Other (non HMO) | Admitting: Family Medicine

## 2022-07-22 VITALS — BP 136/82 | HR 69 | Resp 16 | Ht 65.0 in | Wt 193.0 lb

## 2022-07-22 DIAGNOSIS — Z23 Encounter for immunization: Secondary | ICD-10-CM | POA: Diagnosis not present

## 2022-07-22 DIAGNOSIS — Z1211 Encounter for screening for malignant neoplasm of colon: Secondary | ICD-10-CM | POA: Diagnosis not present

## 2022-07-22 DIAGNOSIS — E669 Obesity, unspecified: Secondary | ICD-10-CM

## 2022-07-22 NOTE — Patient Instructions (Signed)
.   Please review the attached list of medications and notify my office if there are any errors.   . Please bring all of your medications to every appointment so we can make sure that our medication list is the same as yours.   

## 2022-07-22 NOTE — Progress Notes (Unsigned)
     I,Tiffany J Bragg,acting as a scribe for [name], MD.,have documented all relevant documentation on the behalf of [name], MD,as directed by  [name], MD while in the presence of [name], MD.   Established patient visit   Patient: Katrina Collier   DOB: 1966-05-04   56 y.o. Female  MRN: [id number] Visit Date: 07/22/2022  Today's healthcare provider: [name], MD   Chief Complaint  Patient presents with   Follow-up   Subjective    HPI  Patient is here for f/u since being on which she started in January at which time her weight was around 230. Reports side effects of constipation and diarrhea. Feels well otherwise since starting it.  Wt Readings from Last 5 Encounters:  07/22/22 193 lb (87.5 kg)  02/22/22 211 lb 12.8 oz (96.1 kg)  01/24/22 214 lb (97.1 kg)  08/27/21 232 lb (105.2 kg)  12/24/20 230 lb (104.3 kg)     Medications: Outpatient Medications Prior to Visit  Medication Sig   amLODipine (NORVASC) 5 MG tablet Take 1 tablet (5 mg total) by mouth daily.   Misc Natural Products (COLON HERBAL CLEANSER) CAPS Take by mouth.   Semaglutide-Weight Management (WEGOVY) 2.4 MG/0.75ML SOAJ Inject 2.4 mg into the skin once a week.   venlafaxine XR (EFFEXOR XR) 37.5 MG 24 hr capsule Take 1 capsule (37.5 mg total) by mouth daily with breakfast. (Patient not taking: Reported on 07/22/2022)   [DISCONTINUED] amLODipine (NORVASC) 10 MG tablet    No facility-administered medications prior to visit.    Review of Systems  Constitutional:  Negative for appetite change, chills, fatigue and fever.  Respiratory:  Negative for chest tightness and shortness of breath.   Cardiovascular:  Negative for chest pain and palpitations.  Gastrointestinal:  Negative for abdominal pain, nausea and vomiting.  Neurological:  Negative for dizziness and weakness.    {Labs  Heme  Chem  Endocrine  Serology  Results Review (optional):23779}   Objective    BP 136/82    Pulse 69   Resp 16   Ht 5\' 5"  (1.651 m)   Wt 193 lb (87.5 kg)   LMP 12/02/2020   SpO2 100%   BMI 32.12 kg/m  {Show previous vital signs (optional):23777}  Physical Exam  General appearance: Mildly obese female, cooperative and in no acute distress Head: Normocephalic, without obvious abnormality, atraumatic Respiratory: Respirations even and unlabored, normal respiratory rate Extremities: All extremities are intact.  Skin: Skin color, texture, turgor normal. No rashes seen  Psych: Appropriate mood and affect. Neurologic: Mental status: Alert, oriented to person, place, and time, thought content appropriate.   Assessment & Plan     1. Class 1 obesity without serious comorbidity in adult, unspecified BMI, unspecified obesity type Doing very well with Wegovy maintaining >15% weight since initiation in January of 2023.   2. Need for influenza vaccination  - Flu Vaccine QUAD 6+ mos PF IM (Fluarix Quad PF)  3. Colon cancer screening  - Cologuard      The entirety of the information documented in the History of Present Illness, Review of Systems and Physical Exam were personally obtained by me. Portions of this information were initially documented by the CMA and reviewed by me for thoroughness and accuracy.     01-01-1994, MD  [facility] [phone] (phone) [phone] (fax)  [facility]

## 2022-09-13 LAB — COLOGUARD: COLOGUARD: NEGATIVE

## 2022-10-17 ENCOUNTER — Other Ambulatory Visit: Payer: Self-pay | Admitting: Family Medicine

## 2022-10-18 MED ORDER — WEGOVY 2.4 MG/0.75ML ~~LOC~~ SOAJ: 2.4000 mg | SUBCUTANEOUS | 0 refills | Status: DC

## 2022-12-14 IMAGING — CT CT ORBITS W/ CM
3 of 6 series · 13 of 47 positions shown, 15 images · IV contrast (APPLIED)
Comparison: No pertinent prior exams available for comparison.

CLINICAL DATA: Orbital cellulitis suspected. Headache, new or
worsening.

EXAM:
CT HEAD WITHOUT CONTRAST AND ORBITS WITH CONTRAST
TECHNIQUE: Contiguous axial images were obtained from the base of the skull
through the vertex of the head without contrast. Multidetector CT
imaging of the orbits was performed using the standard protocol with
intravenous contrast.

[Series 3: orbits 2.0 hr40 3 st · axial · 0.28mm/px · z∈[-150,-78]mm · 8 of 42 slices shown, 10 images]
[im 3/42  brain]
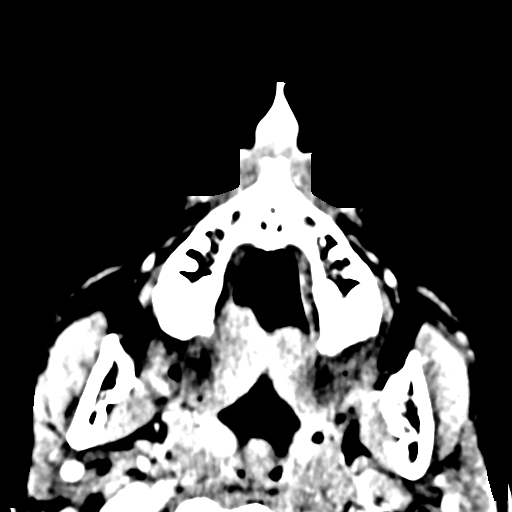
[im 3/42  bone]
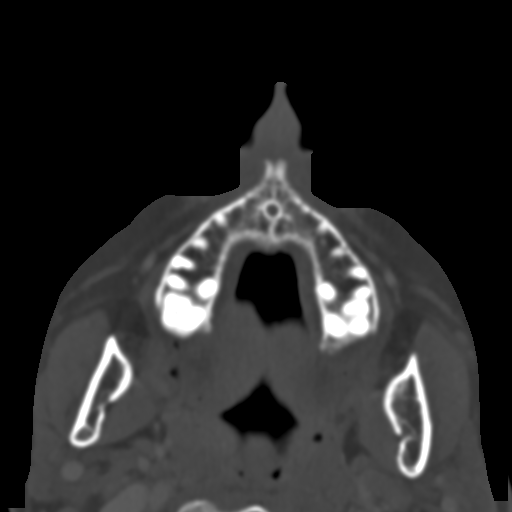
[im 9/42  bone]
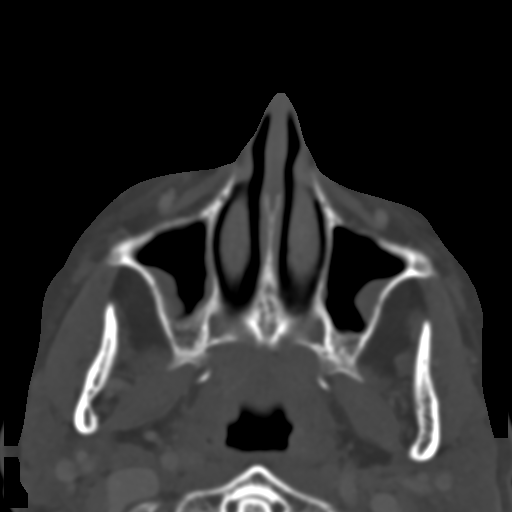
[im 15/42  bone]
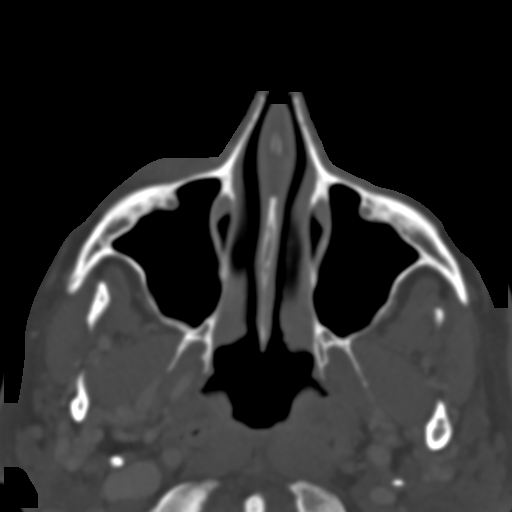
[im 18/42  bone]
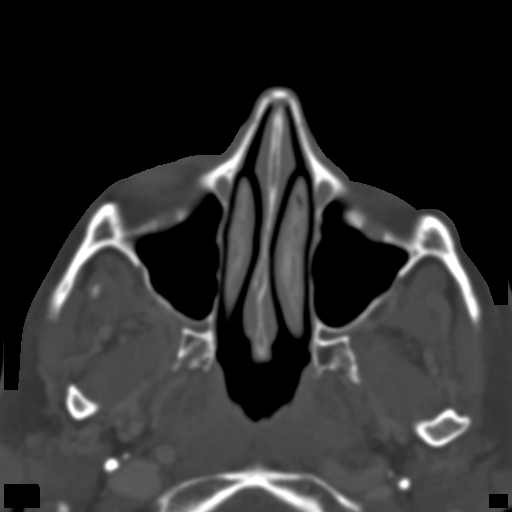
[im 24/42  brain]
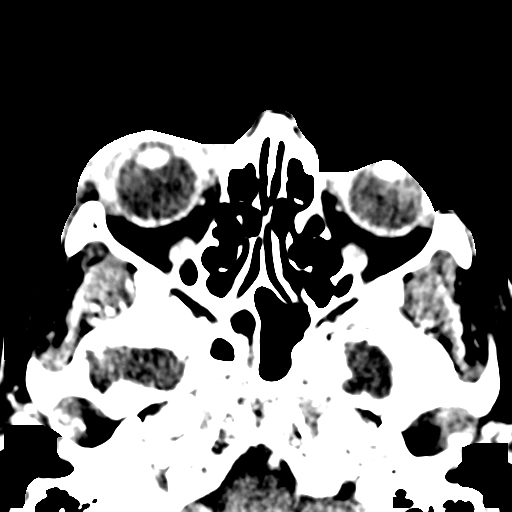
[im 24/42  bone]
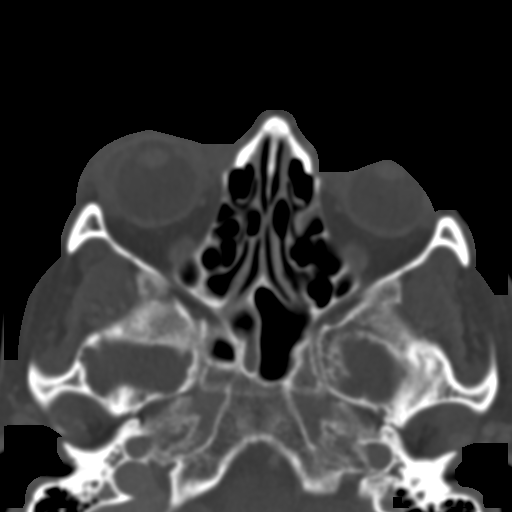
[im 27/42  bone]
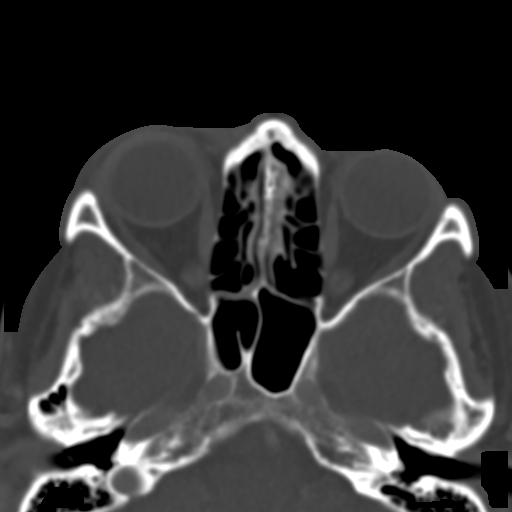
[im 33/42  bone]
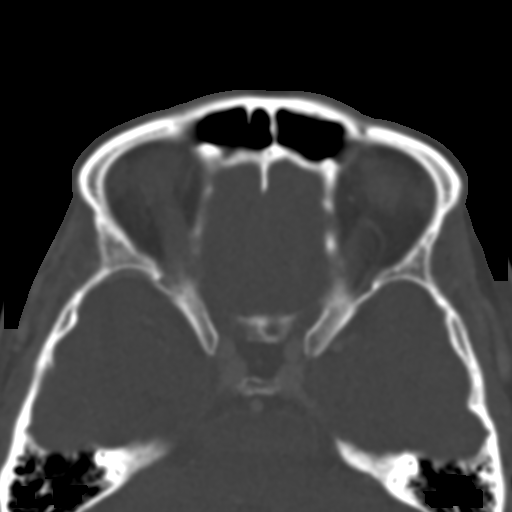
[im 39/42  bone]
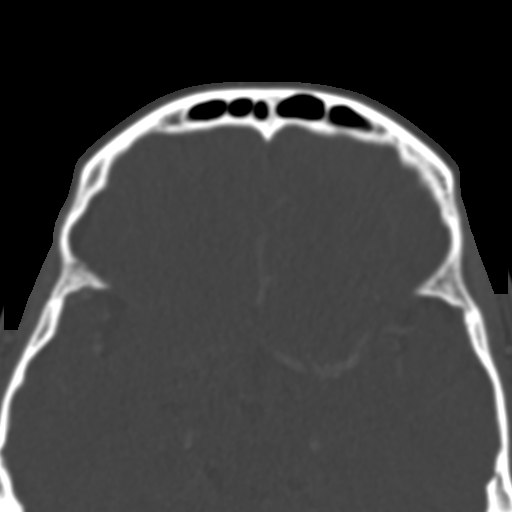

[Series 7: bone cor · coronal · 0.23mm/px · 3 of 72 slices shown]
[im 18/72  bone]
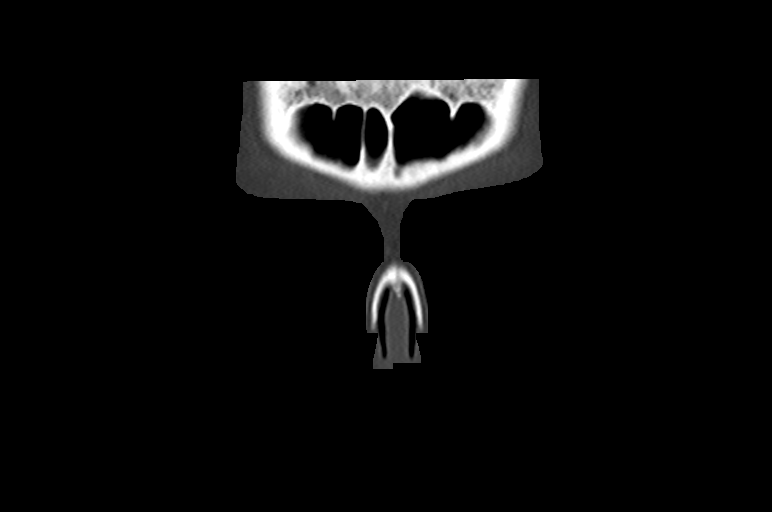
[im 36/72  bone]
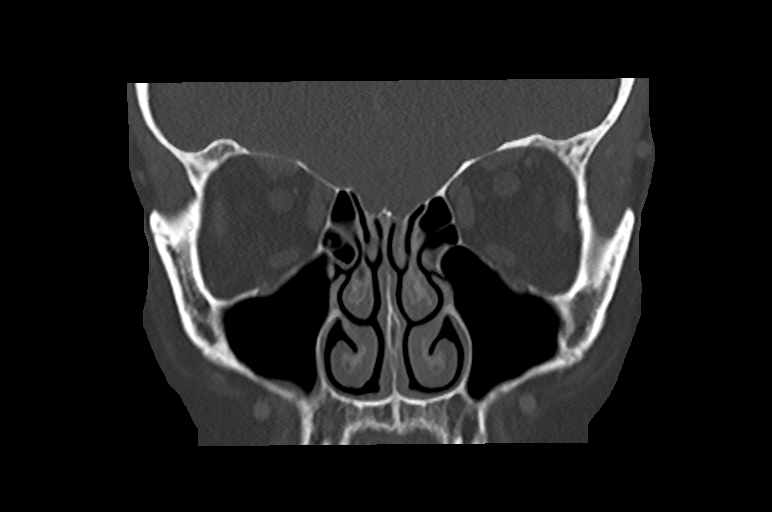
[im 54/72  bone]
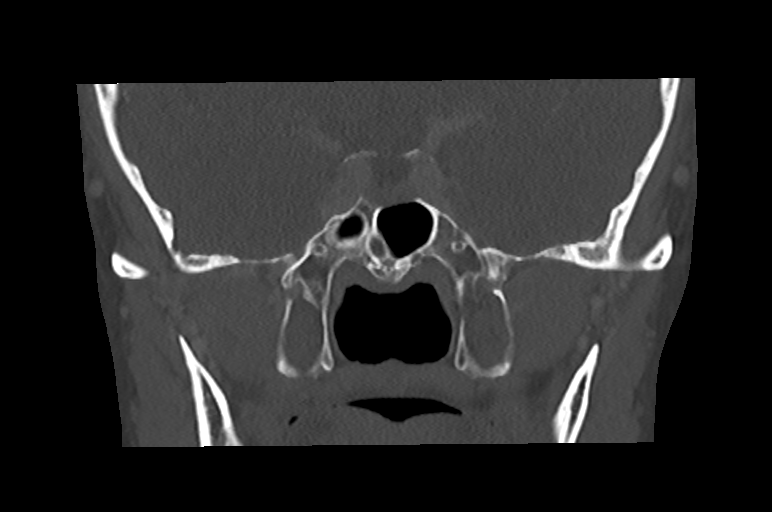

[Series 8: bone sag · sagittal · 0.24mm/px · 2 of 89 slices shown]
[im 30/89  bone]
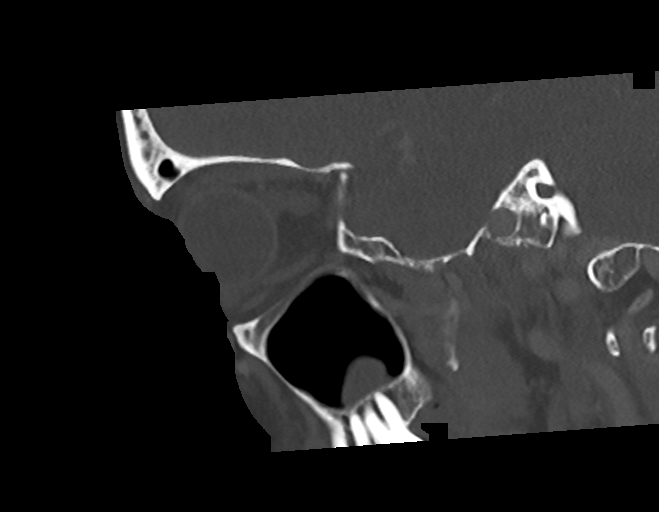
[im 59/89  bone]
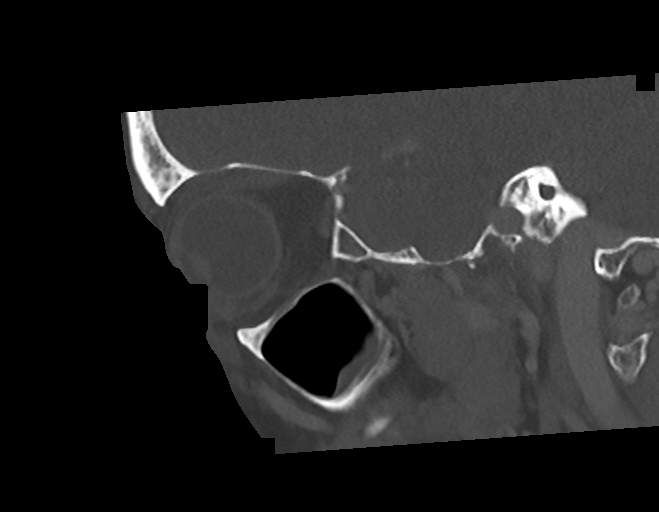

[13 of 47 positions shown; findings below may reference images not displayed]

RADIATION DOSE REDUCTION: This exam was performed according to the
departmental dose-optimization program which includes automated
exposure control, adjustment of the mA and/or kV according to
patient size and/or use of iterative reconstruction technique.

CONTRAST:  75mL OMNIPAQUE IOHEXOL 300 MG/ML  SOLN
FINDINGS: CT HEAD FINDINGS

Brain:

Cerebral volume is normal.

There is no acute intracranial hemorrhage.

No demarcated cortical infarct.

No extra-axial fluid collection.

No evidence of an intracranial mass.

No midline shift.

Vascular: No hyperdense vessel. Atherosclerotic calcifications.

Skull: Normal. Negative for fracture or focal lesion.

CT ORBITS FINDINGS

Orbits: There is prominent right periorbital soft tissue swelling,
induration and enhancement compatible with periorbital cellulitis.
Associated edema along the deep aspect of the upper and lower
eyelids. There is no evidence of postseptal orbital extension of
infection at this time. The globes are normal in size and contour.
The extraocular muscles and optic nerve sheath complexes are
symmetric and unremarkable.

Visualized sinuses: Trace mucosal thickening within the bilateral
frontal and ethmoid sinuses. Mild mucosal thickening within the
right sphenoid sinus. Mild mucosal thickening within the bilateral
maxillary sinuses.

Soft tissues: Prominent right periorbital soft tissue swelling,
induration and enhancement.
IMPRESSION: CT head:

No evidence of acute intracranial abnormality.

CT orbits:

1. Prominent right periorbital soft tissue swelling, induration and
enhancement compatible with periorbital cellulitis. Associated edema
along the deep aspect of the right upper and lower eyelids. No CT
evidence of post-septal orbital extension of infection at this time.
2. Paranasal sinus disease, as described.

## 2022-12-14 IMAGING — CT CT HEAD W/O CM
4 series · 15 of 47 positions shown, 17 images · IV contrast (agent unspecified)
Comparison: No pertinent prior exams available for comparison.

CLINICAL DATA: Orbital cellulitis suspected. Headache, new or
worsening.

EXAM:
CT HEAD WITHOUT CONTRAST AND ORBITS WITH CONTRAST
TECHNIQUE: Contiguous axial images were obtained from the base of the skull
through the vertex of the head without contrast. Multidetector CT
imaging of the orbits was performed using the standard protocol with
intravenous contrast.

[Series 3: head wo · axial · 0.39mm/px · z∈[-109,+6]mm · 7 of 31 slices shown, 9 images]
[im 4/31  brain]
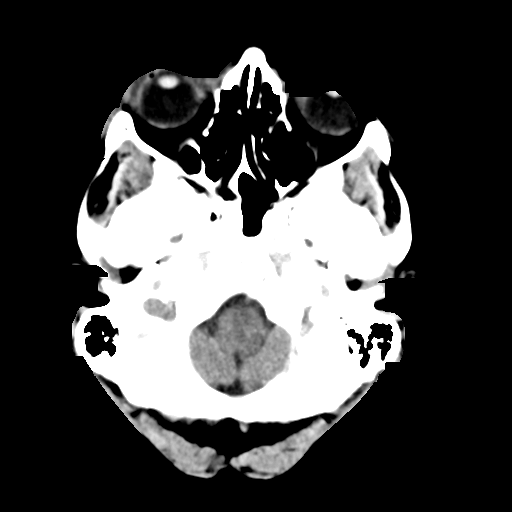
[im 4/31  bone]
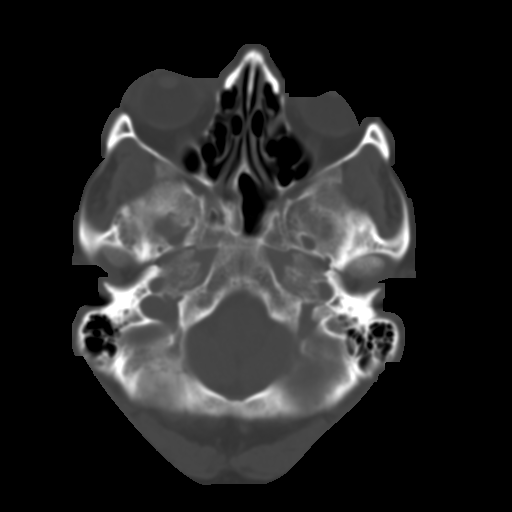
[im 8/31  brain]
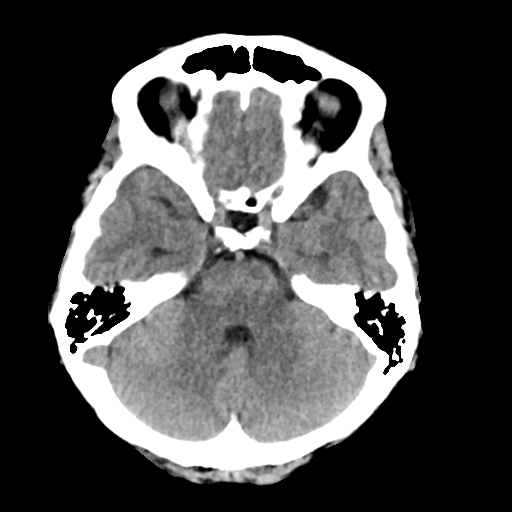
[im 12/31  brain]
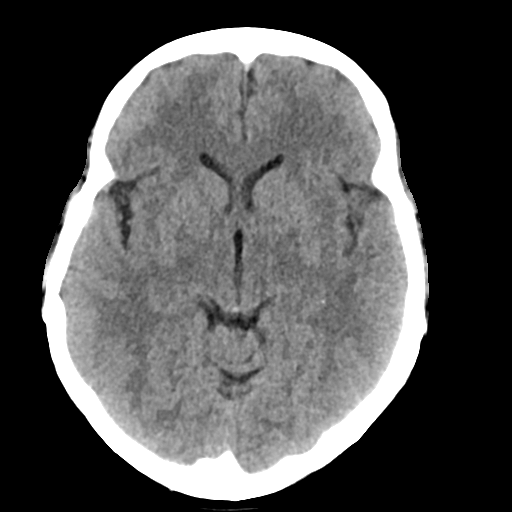
[im 16/31  brain]
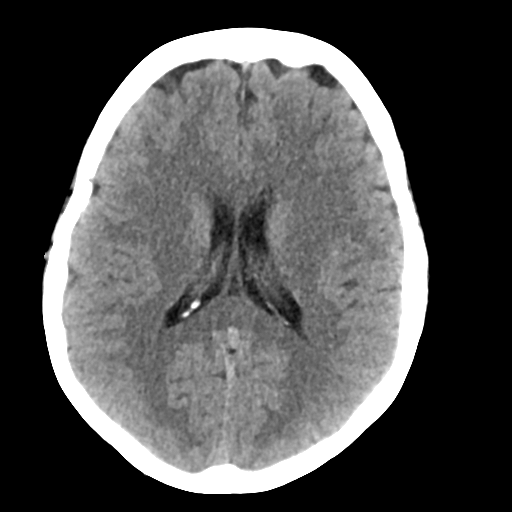
[im 19/31  brain]
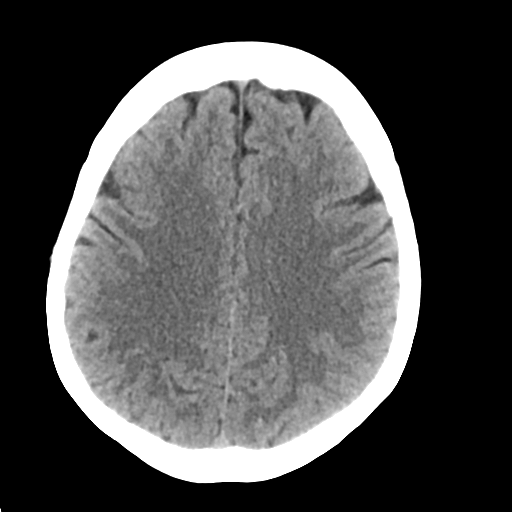
[im 19/31  bone]
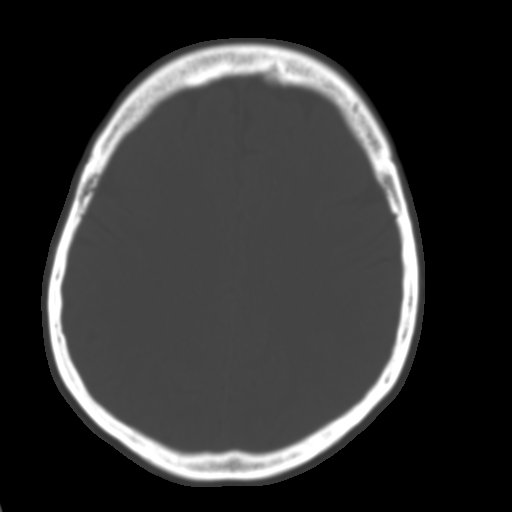
[im 23/31  brain]
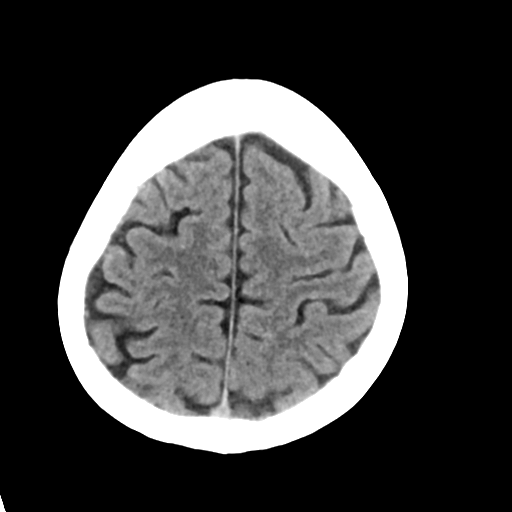
[im 27/31  brain]
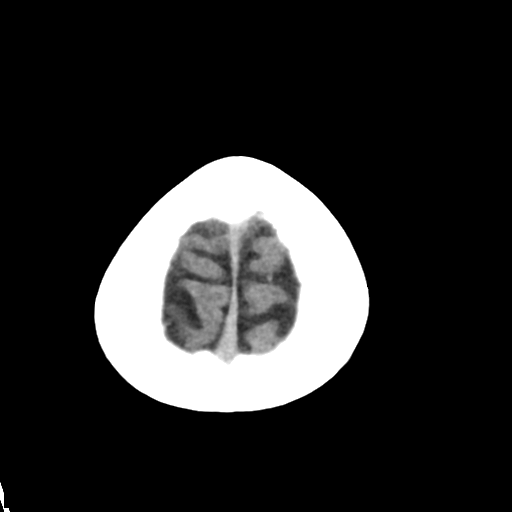

[Series 4: head bone · axial · 0.39mm/px · z∈[-110,-94]mm · 2 of 78 slices shown]
[im 8/78  bone]
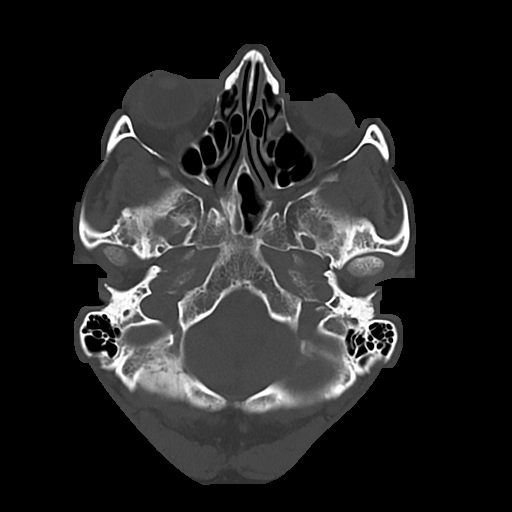
[im 16/78  bone]
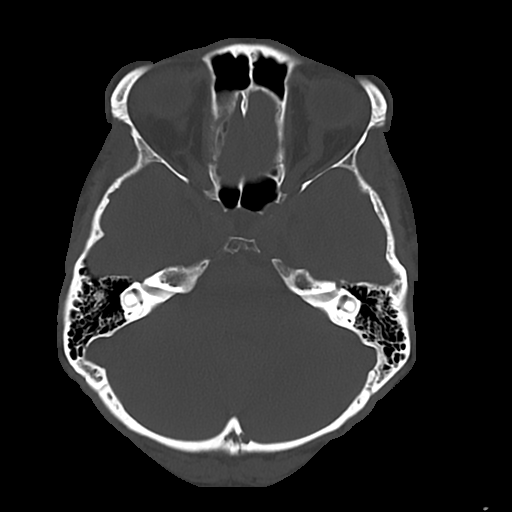

[Series 5: cor soft · coronal · 0.31mm/px · 3 of 64 slices shown]
[im 22/64  brain]
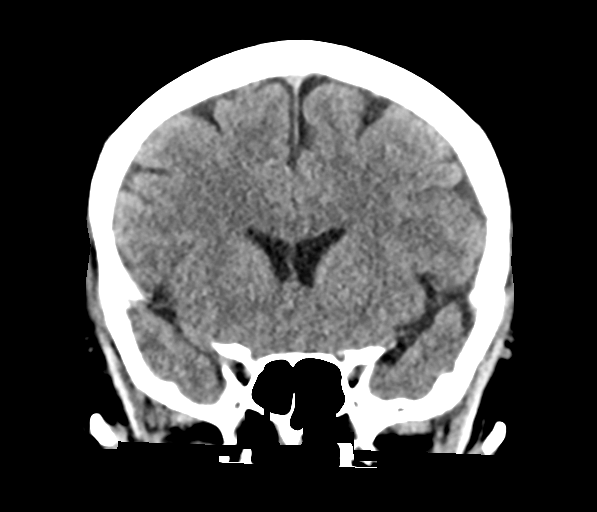
[im 29/64  brain]
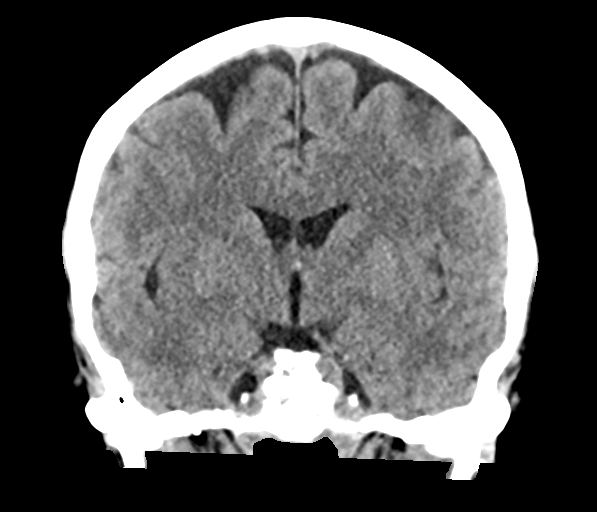
[im 36/64  brain]
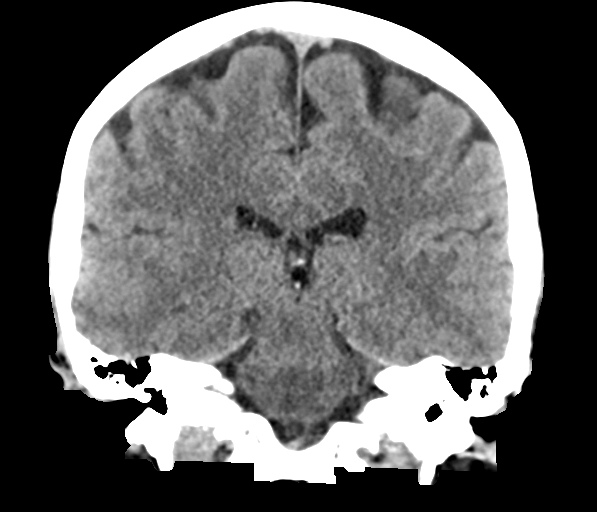

[Series 6: sag soft · sagittal · 0.31mm/px · 3 of 62 slices shown]
[im 21/62  brain]
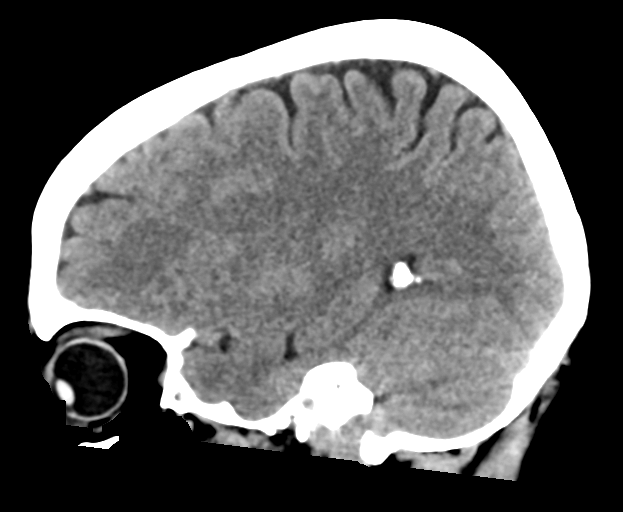
[im 31/62  brain]
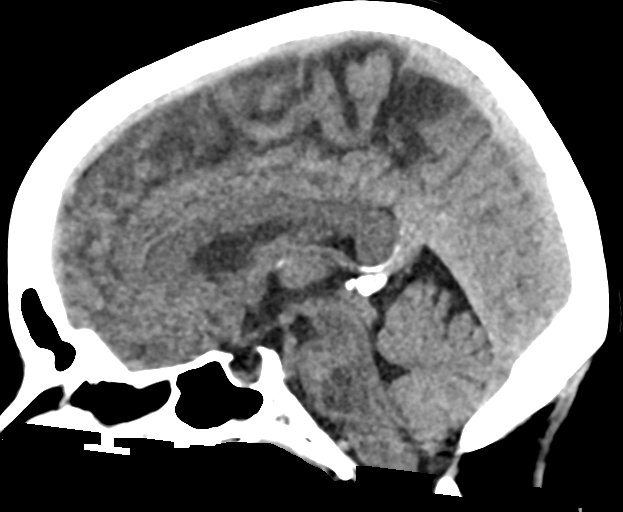
[im 41/62  brain]
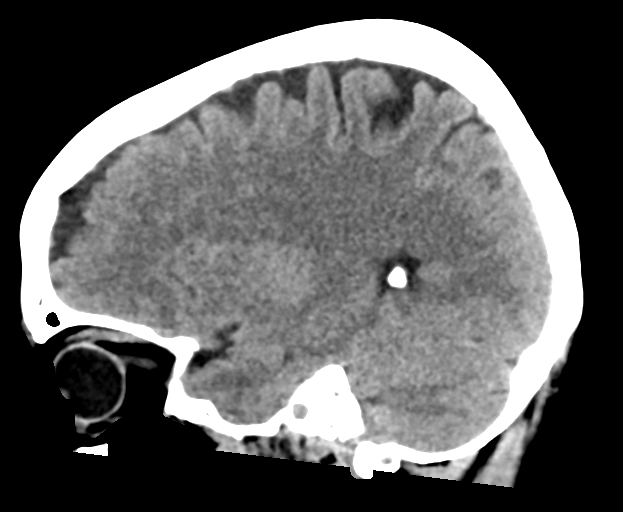

[15 of 47 positions shown; findings below may reference images not displayed]

RADIATION DOSE REDUCTION: This exam was performed according to the
departmental dose-optimization program which includes automated
exposure control, adjustment of the mA and/or kV according to
patient size and/or use of iterative reconstruction technique.

CONTRAST:  75mL OMNIPAQUE IOHEXOL 300 MG/ML  SOLN
FINDINGS: CT HEAD FINDINGS

Brain:

Cerebral volume is normal.

There is no acute intracranial hemorrhage.

No demarcated cortical infarct.

No extra-axial fluid collection.

No evidence of an intracranial mass.

No midline shift.

Vascular: No hyperdense vessel. Atherosclerotic calcifications.

Skull: Normal. Negative for fracture or focal lesion.

CT ORBITS FINDINGS

Orbits: There is prominent right periorbital soft tissue swelling,
induration and enhancement compatible with periorbital cellulitis.
Associated edema along the deep aspect of the upper and lower
eyelids. There is no evidence of postseptal orbital extension of
infection at this time. The globes are normal in size and contour.
The extraocular muscles and optic nerve sheath complexes are
symmetric and unremarkable.

Visualized sinuses: Trace mucosal thickening within the bilateral
frontal and ethmoid sinuses. Mild mucosal thickening within the
right sphenoid sinus. Mild mucosal thickening within the bilateral
maxillary sinuses.

Soft tissues: Prominent right periorbital soft tissue swelling,
induration and enhancement.
IMPRESSION: CT head:

No evidence of acute intracranial abnormality.

CT orbits:

1. Prominent right periorbital soft tissue swelling, induration and
enhancement compatible with periorbital cellulitis. Associated edema
along the deep aspect of the right upper and lower eyelids. No CT
evidence of post-septal orbital extension of infection at this time.
2. Paranasal sinus disease, as described.

## 2023-01-06 ENCOUNTER — Ambulatory Visit (INDEPENDENT_AMBULATORY_CARE_PROVIDER_SITE_OTHER): Payer: Managed Care, Other (non HMO) | Admitting: Family Medicine

## 2023-01-06 ENCOUNTER — Ambulatory Visit: Payer: Self-pay | Admitting: *Deleted

## 2023-01-06 ENCOUNTER — Encounter: Payer: Self-pay | Admitting: Family Medicine

## 2023-01-06 VITALS — BP 114/82 | HR 90 | Temp 98.8°F | Resp 16 | Wt 164.5 lb

## 2023-01-06 DIAGNOSIS — B9689 Other specified bacterial agents as the cause of diseases classified elsewhere: Secondary | ICD-10-CM | POA: Insufficient documentation

## 2023-01-06 DIAGNOSIS — J208 Acute bronchitis due to other specified organisms: Secondary | ICD-10-CM

## 2023-01-06 DIAGNOSIS — F4323 Adjustment disorder with mixed anxiety and depressed mood: Secondary | ICD-10-CM

## 2023-01-06 DIAGNOSIS — Z6827 Body mass index (BMI) 27.0-27.9, adult: Secondary | ICD-10-CM

## 2023-01-06 DIAGNOSIS — E663 Overweight: Secondary | ICD-10-CM | POA: Diagnosis not present

## 2023-01-06 MED ORDER — ALBUTEROL SULFATE HFA 108 (90 BASE) MCG/ACT IN AERS: 2.0000 | INHALATION_SPRAY | Freq: Four times a day (QID) | RESPIRATORY_TRACT | 2 refills | Status: DC | PRN

## 2023-01-06 MED ORDER — AMOXICILLIN-POT CLAVULANATE 875-125 MG PO TABS: 1.0000 | ORAL_TABLET | Freq: Two times a day (BID) | ORAL | 0 refills | Status: DC

## 2023-01-06 NOTE — Progress Notes (Unsigned)
I,Joseline E Rosas,acting as a scribe for Gwyneth Sprout, FNP.,have documented all relevant documentation on the behalf of Gwyneth Sprout, FNP,as directed by  Gwyneth Sprout, FNP while in the presence of Gwyneth Sprout, FNP.   Established patient visit   Patient: Katrina Collier   DOB: Oct 19, 1966   57 y.o. Female  MRN: [id number] Visit Date: 01/06/2023  Today's healthcare provider: Gwyneth Sprout, FNP   Chief Complaint  Patient presents with   Cough   Subjective    Cough This is a new problem. Episode onset: started on Wednesday. The problem has been gradually worsening. The problem occurs constantly. The cough is Productive of sputum (yellow/green). Associated symptoms include nasal congestion, postnasal drip, rhinorrhea and wheezing. Pertinent negatives include no headaches, sore throat or shortness of breath. Associated symptoms comments: Sinus pressure, chest tightness. Nothing aggravates the symptoms. She has tried OTC cough suppressant (Mucinex DM, Dayquil, Advil cold and sinus) for the symptoms. The treatment provided no relief.    Medications: Outpatient Medications Prior to Visit  Medication Sig   amLODipine (NORVASC) 5 MG tablet Take 1 tablet (5 mg total) by mouth daily.   Misc Natural Products (COLON HERBAL CLEANSER) CAPS Take by mouth.   Semaglutide-Weight Management (WEGOVY) 2.4 MG/0.75ML SOAJ Inject 2.4 mg into the skin once a week.   No facility-administered medications prior to visit.    Review of Systems  HENT:  Positive for postnasal drip and rhinorrhea. Negative for sore throat.   Respiratory:  Positive for cough and wheezing. Negative for shortness of breath.   Neurological:  Negative for headaches.     Objective    BP 114/82 (BP Location: Right Arm, Patient Position: Sitting, Cuff Size: Normal)   Pulse 90   Temp 98.8 F (37.1 C) (Oral)   Resp 16   Wt 164 lb 8 oz (74.6 kg)   LMP 12/02/2020   SpO2 99%   BMI 27.37 kg/m   Physical Exam Vitals and  nursing note reviewed.  Constitutional:      General: She is not in acute distress.    Appearance: Normal appearance. She is overweight. She is not ill-appearing, toxic-appearing or diaphoretic.  HENT:     Head: Normocephalic and atraumatic.     Right Ear: Tympanic membrane, ear canal and external ear normal.     Left Ear: Tympanic membrane and ear canal normal.  Cardiovascular:     Rate and Rhythm: Normal rate and regular rhythm.     Pulses: Normal pulses.     Heart sounds: Normal heart sounds. No murmur heard.    No friction rub. No gallop.  Pulmonary:     Effort: Pulmonary effort is normal. No respiratory distress.     Breath sounds: Normal breath sounds. No stridor. No wheezing, rhonchi or rales.  Chest:     Chest wall: No tenderness.  Abdominal:     General: Bowel sounds are normal.     Palpations: Abdomen is soft.  Musculoskeletal:        General: No swelling, tenderness, deformity or signs of injury. Normal range of motion.     Right lower leg: No edema.     Left lower leg: No edema.  Skin:    General: Skin is warm and dry.     Capillary Refill: Capillary refill takes less than 2 seconds.     Coloration: Skin is not jaundiced or pale.     Findings: No bruising, erythema, lesion or rash.  Neurological:     General: No focal deficit present.     Mental Status: She is alert and oriented to person, place, and time. Mental status is at baseline.     Cranial Nerves: No cranial nerve deficit.     Sensory: No sensory deficit.     Motor: No weakness.     Coordination: Coordination normal.  Psychiatric:        Mood and Affect: Mood normal.        Behavior: Behavior normal.        Thought Content: Thought content normal.        Judgment: Judgment normal.    No results found for any visits on 01/06/23.  Assessment & Plan     Problem List Items Addressed This Visit       Respiratory   Acute bacterial bronchitis - Primary    Acute, self limiting Endorses sputum as well  as nasal congestion, PND, rhinorrhea and wheezing. Patient denies travel or sick contacts. No to minimal relief with OTC Recommend treatment with ABX and inhaler RTC as needed      Relevant Medications   amoxicillin-clavulanate (AUGMENTIN) 875-125 MG tablet   albuterol (VENTOLIN HFA) 108 (90 Base) MCG/ACT inhaler     Other   Adjustment disorder with mixed anxiety and depressed mood    Co-dependence concerns with family; noting 3 of her 5 children are living with both her and her husband Also notes pets within the home Patient plans to separate from her spouse once the children move out; discussed how to support someone without allowing the person to become reliant on you as her children are all >98 years old and most have jobs and/or cars. Defers medication at this time; continue to monitor      Overweight with body mass index (BMI) of 27 to 27.9 in adult    Hx of WLS in 2018; however, regained weight once she was asked to work from home s/s COVID Notes that she did not mind working from home; however, it was the stress from all her immediate family and extended family which contributed to her weight regain Was started on Wegovy 14 months ago; Has lost ~70 lbs. Continue to recommend balanced, lower carb meals. Smaller meal size, adding snacks. Choosing water as drink of choice and increasing purposeful exercise.       Return if symptoms worsen or fail to improve.     Vonna Kotyk, FNP, have reviewed all documentation for this visit. The documentation on 01/08/23 for the exam, diagnosis, procedures, and orders are all accurate and complete.  Gwyneth Sprout, [place] [phone] (phone) [phone] (fax)  [place]

## 2023-01-06 NOTE — Patient Instructions (Addendum)
Some things that can make you feel better are: - Increased rest - Increasing Fluids - Acetaminophen / ibuprofen as needed for fever/pain.  - Salt water gargling, chloraseptic spray and throat lozenges - OTC pseudoephedrine.  - Mucinex.  - Saline sinus flushes or a neti pot.  - Humidifying the air.  

## 2023-01-06 NOTE — Telephone Encounter (Signed)
Reason for Disposition  [1] Continuous (nonstop) coughing interferes with work or school AND [2] no improvement using cough treatment per Care Advice  Answer Assessment - Initial Assessment Questions 1. ONSET: "When did the cough begin?"      Couple days, fever yesterday- Saturday lost voice 2. SEVERITY: "How bad is the cough today?"      Coughing up mucus 3. SPUTUM: "Describe the color of your sputum" (none, dry cough; clear, white, yellow, green)     Yellow/green, sticky 4. HEMOPTYSIS: "Are you coughing up any blood?" If so ask: "How much?" (flecks, streaks, tablespoons, etc.)     no 5. DIFFICULTY BREATHING: "Are you having difficulty breathing?" If Yes, ask: "How bad is it?" (e.g., mild, moderate, severe)    - MILD: No SOB at rest, mild SOB with walking, speaks normally in sentences, can lie down, no retractions, pulse < 100.    - MODERATE: SOB at rest, SOB with minimal exertion and prefers to sit, cannot lie down flat, speaks in phrases, mild retractions, audible wheezing, pulse 100-120.    - SEVERE: Very SOB at rest, speaks in single words, struggling to breathe, sitting hunched forward, retractions, pulse > 120      wheezing 6. FEVER: "Do you have a fever?" If Yes, ask: "What is your temperature, how was it measured, and when did it start?"     Fever yesterday- did not take temperature  7. CARDIAC HISTORY: "Do you have any history of heart disease?" (e.g., heart attack, congestive heart failure)      no 8. LUNG HISTORY: "Do you have any history of lung disease?"  (e.g., pulmonary embolus, asthma, emphysema)     no 9. PE RISK FACTORS: "Do you have a history of blood clots?" (or: recent major surgery, recent prolonged travel, bedridden)     no 10. OTHER SYMPTOMS: "Do you have any other symptoms?" (e.g., runny nose, wheezing, chest pain)       Wheezing, nasal drainage/congestion  Answer Assessment - Initial Assessment Questions 1. RESPIRATORY STATUS: "Describe your breathing?" (e.g.,  wheezing, shortness of breath, unable to speak, severe coughing)      Wheezing with cough 2. ONSET: "When did this breathing problem begin?"      *No Answer* 3. PATTERN "Does the difficult breathing come and go, or has it been constant since it started?"      *No Answer* 4. SEVERITY: "How bad is your breathing?" (e.g., mild, moderate, severe)    - MILD: No SOB at rest, mild SOB with walking, speaks normally in sentences, can lie down, no retractions, pulse < 100.    - MODERATE: SOB at rest, SOB with minimal exertion and prefers to sit, cannot lie down flat, speaks in phrases, mild retractions, audible wheezing, pulse 100-120.    - SEVERE: Very SOB at rest, speaks in single words, struggling to breathe, sitting hunched forward, retractions, pulse > 120      *No Answer* 5. RECURRENT SYMPTOM: "Have you had difficulty breathing before?" If Yes, ask: "When was the last time?" and "What happened that time?"      *No Answer* 6. CARDIAC HISTORY: "Do you have any history of heart disease?" (e.g., heart attack, angina, bypass surgery, angioplasty)      *No Answer* 7. LUNG HISTORY: "Do you have any history of lung disease?"  (e.g., pulmonary embolus, asthma, emphysema)     *No Answer* 8. CAUSE: "What do you think is causing the breathing problem?"      *No Answer* 9.  OTHER SYMPTOMS: "Do you have any other symptoms? (e.g., dizziness, runny nose, cough, chest pain, fever)     *No Answer* 10. O2 SATURATION MONITOR:  "Do you use an oxygen saturation monitor (pulse oximeter) at home?" If Yes, ask: "What is your reading (oxygen level) today?" "What is your usual oxygen saturation reading?" (e.g., 95%)       *No Answer* 11. PREGNANCY: "Is there any chance you are pregnant?" "When was your last menstrual period?"       *No Answer* 12. TRAVEL: "Have you traveled out of the country in the last month?" (e.g., travel history, exposures)       *No Answer*  Protocols used: Breathing Difficulty-A-AH, Cough - Acute  Productive-A-AH

## 2023-01-06 NOTE — Telephone Encounter (Signed)
  Chief Complaint: cough, wheezing Symptoms: productive cough with yellow/green mucus, nasal discharge/congestion, wheezing Frequency: symptoms started Saturday Pertinent Negatives: Patient denies fever today, SOB Disposition: '[]'$ ED /'[]'$ Urgent Care (no appt availability in office) / '[x]'$ Appointment(In office/virtual)/ '[]'$  [facility]/ '[]'$ Home Care/ '[]'$ Refused Recommended Disposition /'[]'$ [facility]/ '[]'$  Follow-up with PCP Additional Notes: Patient called with wheezing after coughing spells. No SOB or breathing difficulty. Appointment has been scheduled.

## 2023-01-08 ENCOUNTER — Encounter: Payer: Self-pay | Admitting: Family Medicine

## 2023-01-08 DIAGNOSIS — F4323 Adjustment disorder with mixed anxiety and depressed mood: Secondary | ICD-10-CM | POA: Insufficient documentation

## 2023-01-08 DIAGNOSIS — E663 Overweight: Secondary | ICD-10-CM | POA: Insufficient documentation

## 2023-01-08 NOTE — Assessment & Plan Note (Signed)
Acute, self limiting Endorses sputum as well as nasal congestion, PND, rhinorrhea and wheezing. Patient denies travel or sick contacts. No to minimal relief with OTC Recommend treatment with ABX and inhaler RTC as needed

## 2023-01-08 NOTE — Assessment & Plan Note (Signed)
Co-dependence concerns with family; noting 3 of her 5 children are living with both her and her husband Also notes pets within the home Patient plans to separate from her spouse once the children move out; discussed how to support someone without allowing the person to become reliant on you as her children are all >56 years old and most have jobs and/or cars. Defers medication at this time; continue to monitor

## 2023-01-08 NOTE — Assessment & Plan Note (Signed)
Hx of WLS in 2018; however, regained weight once she was asked to work from home s/s COVID Notes that she did not mind working from home; however, it was the stress from all her immediate family and extended family which contributed to her weight regain Was started on Wegovy 14 months ago; Has lost ~70 lbs. Continue to recommend balanced, lower carb meals. Smaller meal size, adding snacks. Choosing water as drink of choice and increasing purposeful exercise.

## 2023-01-16 ENCOUNTER — Other Ambulatory Visit: Payer: Self-pay | Admitting: Family Medicine

## 2023-01-16 DIAGNOSIS — I10 Essential (primary) hypertension: Secondary | ICD-10-CM

## 2023-01-17 ENCOUNTER — Other Ambulatory Visit: Payer: Self-pay | Admitting: Family Medicine

## 2023-01-17 NOTE — Telephone Encounter (Signed)
Requested Prescriptions  Pending Prescriptions Disp Refills   amLODipine (NORVASC) 5 MG tablet [Pharmacy Med Name: AMLODIPINE BESYLATE '5MG'$  TABLETS] 90 tablet 1    Sig: TAKE 1 TABLET(5 MG) BY MOUTH DAILY     Cardiovascular: Calcium Channel Blockers 2 Passed - 01/16/2023  3:46 AM      Passed - Last BP in normal range    BP Readings from Last 1 Encounters:  01/06/23 114/82         Passed - Last Heart Rate in normal range    Pulse Readings from Last 1 Encounters:  01/06/23 90         Passed - Valid encounter within last 6 months    Recent Outpatient Visits           1 week ago Acute bacterial bronchitis   [place] Tally Joe T, FNP   5 months ago Class 1 obesity without serious comorbidity in adult, unspecified BMI, unspecified obesity type   [facility] Birdie Sons, MD   10 months ago Situational anxiety   [facility] Birdie Sons, MD   10 months ago Morbid obesity [facility]   [facility] Birdie Sons, MD   1 year ago Morbid obesity [facility])    [facility] Birdie Sons, MD

## 2023-01-18 ENCOUNTER — Telehealth: Payer: Self-pay | Admitting: Family Medicine

## 2023-01-18 MED ORDER — WEGOVY 2.4 MG/0.75ML ~~LOC~~ SOAJ: 2.4000 mg | SUBCUTANEOUS | 0 refills | Status: DC

## 2023-01-18 NOTE — Telephone Encounter (Signed)
[facility] pharmacy requesting prior authorization  [employer]: [id number] Name: Katrina Collier 2.4 MG/0.75 ML auto injectors

## 2023-01-20 NOTE — Telephone Encounter (Signed)
Message from plan: Request Reference Number: [id number]. WEGOVY INJ 2.4MG  is approved through 07/23/2023. Your patient may now fill this prescription and it will be covered.. Authorization Expiration Date: July 23, 2023.

## 2023-01-20 NOTE — Telephone Encounter (Signed)
Status: PA Response - ApprovedCreated: March 13th, 2024 [id number]: March 15th, 2024

## 2023-05-18 ENCOUNTER — Other Ambulatory Visit: Payer: Self-pay | Admitting: Family Medicine

## 2023-06-08 ENCOUNTER — Ambulatory Visit: Payer: Self-pay | Admitting: *Deleted

## 2023-06-08 ENCOUNTER — Emergency Department: Payer: Managed Care, Other (non HMO)

## 2023-06-08 ENCOUNTER — Emergency Department
Admission: EM | Admit: 2023-06-08 | Discharge: 2023-06-08 | Disposition: A | Discharge status: Home / Self Care | Payer: Managed Care, Other (non HMO) | Attending: Emergency Medicine | Admitting: Emergency Medicine

## 2023-06-08 DIAGNOSIS — R0789 Other chest pain: Secondary | ICD-10-CM | POA: Insufficient documentation

## 2023-06-08 LAB — BASIC METABOLIC PANEL
Anion gap: 10 (ref 5–15)
BUN: 9 mg/dL (ref 6–20)
CO2: 24 mmol/L (ref 22–32)
Calcium: 9.2 mg/dL (ref 8.9–10.3)
Chloride: 105 mmol/L (ref 98–111)
Creatinine, Ser: 0.65 mg/dL (ref 0.44–1.00)
GFR, Estimated: >60 mL/min (ref >60)
Glucose, Bld: 88 mg/dL (ref 70–99)
Potassium: 3.6 mmol/L (ref 3.5–5.1)
Sodium: 139 mmol/L (ref 135–145)

## 2023-06-08 LAB — CBC
HCT: 41.4 % (ref 36.0–46.0)
Hemoglobin: 13.5 g/dL (ref 12.0–15.0)
MCH: 28.4 pg (ref 26.0–34.0)
MCHC: 32.6 g/dL (ref 30.0–36.0)
MCV: 87 fL (ref 80.0–100.0)
Platelets: 285 10*3/uL (ref 150–400)
RBC: 4.76 MIL/uL (ref 3.87–5.11)
RDW: 13.2 % (ref 11.5–15.5)
WBC: 6.6 10*3/uL (ref 4.0–10.5)
nRBC: 0 % (ref 0.0–0.2)

## 2023-06-08 LAB — TROPONIN I (HIGH SENSITIVITY): Troponin I (High Sensitivity): 2 ng/L (ref <18)

## 2023-06-08 NOTE — ED Provider Notes (Signed)
[facility] Provider Note    Event Date/Time   First MD Initiated Contact with Patient 06/08/23 1631     (approximate)   History   Chest Pain   HPI  Katrina Collier is a 57 y.o. female who presents with several days of left axillary, left anterior chest discomfort.  She reports she came today because it became constant for period of time.  She denies shortness of breath.  No pleurisy.  No fevers chills or cough.  No history of CAD.  She does not smoke.  She thinks this is related to stress     Physical Exam   Triage Vital Signs: ED Triage Vitals  Encounter Vitals Group     BP 06/08/23 1413 (!) 165/100     Systolic BP Percentile --      Diastolic BP Percentile --      Pulse Rate 06/08/23 1413 78     Resp 06/08/23 1413 18     Temp 06/08/23 1413 98.2 F (36.8 C)     Temp Source 06/08/23 1413 Oral     SpO2 06/08/23 1642 100 %     Weight 06/08/23 1414 71.2 kg (157 lb)     Height 06/08/23 1414 1.651 m (5\' 5" )     Head Circumference --      Peak Flow --      Pain Score 06/08/23 1414 3     Pain Loc --      Pain Education --      Exclude from Growth Chart --     Most recent vital signs: Vitals:   06/08/23 1635 06/08/23 1642  BP: (!) 150/82   Pulse: 66   Resp: (!) 21   Temp:    SpO2:  100%     General: Awake, no distress.  CV:  Good peripheral perfusion.  No chest wall tenderness to palpation Resp:  Normal effort.  Abd:  No distention.  Other:  No calf pain or swelling   ED Results / Procedures / Treatments   Labs (all labs ordered are listed, but only abnormal results are displayed) Labs Reviewed  BASIC METABOLIC PANEL  CBC  TROPONIN I (HIGH SENSITIVITY)     EKG  ED ECG REPORT I, Jene Every, the attending physician, personally viewed and interpreted this ECG.  Date: 06/08/2023  Rhythm: normal sinus rhythm QRS Axis: normal Intervals: normal ST/T Wave abnormalities: normal Narrative Interpretation: no evidence of  acute ischemia    RADIOLOGY Chest x-ray viewed interpret by me, no acute abnormality    PROCEDURES:  Critical Care performed:   Procedures   MEDICATIONS ORDERED IN ED: Medications - No data to display   IMPRESSION / MDM / ASSESSMENT AND PLAN / ED COURSE  I reviewed the triage vital signs and the nursing notes. Patient's presentation is most consistent with acute presentation with potential threat to life or bodily function.  Patient presents with chest discomfort as detailed above, differential includes ACS, angina, doubt PE given no tachycardia, no pleurisy, doubt pneumonia, given no fever or chills.  Stress is a possibility, musculoskeletal pain  EKG generally reassuring, high sensitive troponin is normal.  Chest x-ray without acute abnormality.  Lab work other is unremarkable  Discussed obtaining CT angiography to evaluate for PE however patient declined she states that she feels her breathing is normal and that is not necessary.  She does not want any further workup at this time, does agree to cardiology outpatient follow-up, strict return precautions  discussed        FINAL CLINICAL IMPRESSION(S) / ED DIAGNOSES   Final diagnoses:  Atypical chest pain     Rx / DC Orders   ED Discharge Orders          Ordered    Ambulatory referral to Cardiology       Comments: If you have not heard from the Cardiology office within the next 72 hours please call [phone].   06/08/23 1704             Note:  This document was prepared using Dragon voice recognition software and may include unintentional dictation errors.   Jene Every, MD 06/08/23 [phone]

## 2023-06-08 NOTE — Telephone Encounter (Addendum)
  Chief Complaint: Chest pain Symptoms: Chest pain in middle of chest radiating to her left armpit.   Very tired Frequency: For the last week or so and getting worse.  Today it's more pronounced.  Pertinent Negatives: Patient denies dizziness, or cardiac history.    Disposition: [x] ED /[] Urgent Care (no appt availability in office) / [] Appointment(In office/virtual)/ []  [facility]/ [] Home Care/ [] Refused Recommended Disposition /[] [facility] [facility]/ []  Follow-up with PCP Additional Notes: Agreeable to going to [facility].   She's at work so "I'm going to talk with my boss".   I encouraged her to have someone go with her which she said someone could go with her.     Message sent to Dr. Sherrie Mustache high priority.   I also called the office and spoke with [place] letting he know I just sent my notes over.

## 2023-06-08 NOTE — ED Notes (Signed)
Blue top sent to lab. 

## 2023-06-08 NOTE — ED Notes (Signed)
ED Provider at bedside. 

## 2023-06-08 NOTE — ED Triage Notes (Signed)
Pt presents to the ED POV from home. States that daughter dropped her off. Pt states that she has been having some left sided chest pain that started a few days ago, but states that the pain has been constant today. Pt describes this burning sensation ago with twinges of sharper pain that radiates into her under arm. Pt has hx of hypertension and has been taking BP medication as prescribed. A&Ox4. Ambulatory to triage room.

## 2023-06-08 NOTE — Telephone Encounter (Signed)
Reason for Disposition  Pain also in shoulder(s) or arm(s) or jaw  (Exception: Pain is clearly made worse by movement.)    Pain radiating to left armpit  Answer Assessment - Initial Assessment Questions 1. LOCATION: "Where does it hurt?"       I'm having chest pain that is sharp.    It's in my chest and towards my left armpit.    I'm tired and I gasp at times.   I'm having headaches because I feel like my BP is up    I've been angry lately.   That's when my symptoms started.   Middle of my chest towards my left armpit.   No cardiac history.   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 3. ONSET: "When did the chest pain begin?" (Minutes, hours or days)      The chest pain started a week so ago.    Today I feel it more pronounced.  4. PATTERN: "Does the pain come and go, or has it been constant since it started?"  "Does it get worse with exertion?"      Nothing that makes it worse or better. 5. DURATION: "How long does it last" (e.g., seconds, minutes, hours)     constant 6. SEVERITY: "How bad is the pain?"  (e.g., Scale 1-10; mild, moderate, or severe)    - MILD (1-3): doesn't interfere with normal activities     - MODERATE (4-7): interferes with normal activities or awakens from sleep    - SEVERE (8-10): excruciating pain, unable to do any normal activities       It's like 3-4/10    It's nagging pain 7. CARDIAC RISK FACTORS: "Do you have any history of heart problems or risk factors for heart disease?" (e.g., angina, prior heart attack; diabetes, high blood pressure, high cholesterol, smoker, or strong family history of heart disease)     No 8. PULMONARY RISK FACTORS: "Do you have any history of lung disease?"  (e.g., blood clots in lung, asthma, emphysema, birth control pills)     No blood clots or asthma.   I'm not on hormone replacement.     I'm on Wegovy for a year and 1/2 without any  problems. 9. CAUSE: "What do you think is causing the chest pain?"     Being angry and my BP being elevated, possibly 10. OTHER SYMPTOMS: "Do you have any other symptoms?" (e.g., dizziness, nausea, vomiting, sweating, fever, difficulty breathing, cough)       Very tired, having bad headaches.   Dealing with anger. 11. PREGNANCY: "Is there any chance you are pregnant?" "When was your last menstrual period?"       Not asked due to age   Having hot flashes as a result of menopause but nothing new or different.  Protocols used: Chest Pain-A-AH

## 2023-06-14 ENCOUNTER — Ambulatory Visit: Payer: Managed Care, Other (non HMO) | Admitting: Family Medicine

## 2023-06-14 ENCOUNTER — Telehealth: Payer: Self-pay | Admitting: Family Medicine

## 2023-06-14 ENCOUNTER — Encounter: Payer: Self-pay | Admitting: Family Medicine

## 2023-06-14 MED ORDER — WEGOVY 2.4 MG/0.75ML ~~LOC~~ SOAJ
2.4000 mg | SUBCUTANEOUS | 3 refills | Status: DC
Start: 2023-06-14 — End: 2023-10-01

## 2023-06-14 NOTE — Progress Notes (Signed)
Established patient visit   Patient: Katrina Collier   DOB: Jan 15, 1966   57 y.o. Female  MRN: [id number] Visit Date: 06/14/2023  Today's healthcare provider: [name], MD   Chief Complaint  Patient presents with   Medication Management    Patient needs refill of Wegovy   Obesity    Patient has been on [facility] for 1.5 years.  She states she was 241 when started and her weight now is 157.  Patient denies side effects with the exception of day 1 after taking her shot she may have an episode or two of diarrhea.     Hypertension    Patient also noted that she was in the ER last week having some chest pain.  She said after all of the testing they did she was told everything looked good except that her blood pressure was elevated.  BP Readings from Last 3 Encounters: 06/14/23 : (!) 145/96 06/08/23 : (!) 150/82 01/06/23 : 114/82     Subjective    HPI   Discussed the use of AI scribe software for clinical note transcription with the patient, who gave verbal consent to proceed.  History of Present Illness   The patient, currently on Wegovy for weight management, presented for a follow-up consultation. She reported an episode of lateral chest pain last week, which was localized and radiated under her left arm. The pain was constant and lasted for a couple of days, prompting a visit to the ER. The patient underwent blood work, EKG, and x-rays, all of which were reported as normal. The patient's blood pressure was slightly elevated at the time. She attributed the chest pain to stress, as she has been experiencing a significant amount of it recently. Since the ER visit, the patient has not experienced any recurrence of the chest pain.  The patient also reported a mild, persistent headache since last week, which she believes may be related to her slightly elevated blood pressure. She has been monitoring her blood pressure at home, with readings mostly in the 120s over 80s.  Regarding  her weight management, the patient has been tolerating Wegovy well, with the only side effect being transient diarrhea shortly after administration. She has been experimenting with taking the medication every two weeks instead of weekly, and has been able to maintain her weight with this regimen. She expressed a desire to continue this dosing schedule to prevent potential weight regain.  The patient also mentioned a history of arthritis, but reported that flare-ups have been less frequent recently. She has been managing the arthritis symptoms without medication due to concerns about potential side effects. She is also due for a mammogram and plans to schedule an appointment for this.      Wt Readings from Last 8 Encounters:  06/14/23 157 lb (71.2 kg)  06/08/23 157 lb (71.2 kg)  01/06/23 164 lb 8 oz (74.6 kg)  07/22/22 193 lb (87.5 kg)  02/22/22 211 lb 12.8 oz (96.1 kg)  01/24/22 214 lb (97.1 kg)  08/27/21 232 lb (105.2 kg)  12/24/20 230 lb (104.3 kg)     Medications: Outpatient Medications Prior to Visit  Medication Sig   albuterol (VENTOLIN HFA) 108 (90 Base) MCG/ACT inhaler Inhale 2 puffs into the lungs every 6 (six) hours as needed for wheezing or shortness of breath.   amLODipine (NORVASC) 5 MG tablet TAKE 1 TABLET(5 MG) BY MOUTH DAILY   Misc Natural Products (COLON HERBAL CLEANSER) CAPS Take by mouth.  Semaglutide-Weight Management (WEGOVY) 2.4 MG/0.75ML SOAJ Inject 2.4 mg into the skin once a week.   [DISCONTINUED] amoxicillin-clavulanate (AUGMENTIN) 875-125 MG tablet Take 1 tablet by mouth 2 (two) times daily.   No facility-administered medications prior to visit.        Objective    BP (!) 145/96 (BP Location: Left Arm, Patient Position: Sitting, Cuff Size: Normal)   Pulse 74   Temp 98.2 F (36.8 C) (Oral)   Resp 16   Ht 5\' 5"  (1.651 m)   Wt 157 lb (71.2 kg)   LMP 12/02/2020   BMI 26.13 kg/m   Physical Exam   General: Appearance:    Well developed, well  nourished female in no acute distress  Eyes:    PERRL, conjunctiva/corneas clear, EOM's intact       Lungs:     Clear to auscultation bilaterally, respirations unlabored  Heart:    Normal heart rate. Normal rhythm.  No murmurs, rubs, or gallops.    MS:   All extremities are intact.    Neurologic:   Awake, alert, oriented x 3. No apparent focal neurological defect.         Assessment & Plan     Assessment and Plan    Weight Management Currently on Wegovy, which has been effective in weight loss. Patient has been spacing out doses over 3-4 weeks and has been able to maintain weight. -Continue Wegovy, potentially every two weeks as tolerated.   Chest Pain Presented to ER last week with left-sided chest pain radiating to underarm, consistent musculoskeletal pain. Has had pain since ER visit.  Workup including EKG, chest x-ray, and cardiac enzymes were normal. No recurrence of pain since ER visit. -Continue to monitor symptoms.  Hypertension Blood pressure was elevated during ER visit, but home readings have been in the 120s/80s. Mild headache reported, possibly related to blood pressure. -Continue current regimen of Amlodipine. -Encouraged to monitor blood pressure at home and report if consistently elevated. Was previously on hctz and will consider starting back on that if home BP elevated or if headaches persist.   Health maintenance Overdue for mammogram and gyn exam -Encouraged to schedule mammogram appointment at [facility].   Follow-up -Schedule follow-up appointment in six months (February 2025) to monitor blood pressure and effectiveness of Wegovy.            [name], MD  [facility] [phone] (phone) [phone] (fax)  [facility]

## 2023-06-14 NOTE — Telephone Encounter (Signed)
[facility] pharmacy is requesting prior authorization Key: [id number] Name: Katrina Collier 2.4 MG/0.75 ML auto injectors

## 2023-06-14 NOTE — Patient Instructions (Signed)
.   Please review the attached list of medications and notify my office if there are any errors.   . Please bring all of your medications to every appointment so we can make sure that our medication list is the same as yours.   

## 2023-06-19 NOTE — Telephone Encounter (Signed)
Message from [facility] INJ 2.4MG  was previously approved on [id number] from 01/20/2023 to 07/23/2023. You will be able to fill a prescription for this medication at your pharmacy. If your pharmacy has questions regarding the processing of your prescription, please have them call the [facility] pharmacy help desk at [phone]. **Weight [employer] enrollment is also required for product coverage. Please visit [email or web address] to enroll.

## 2023-06-19 NOTE — Telephone Encounter (Signed)
Message sent to patient via mychart

## 2023-09-27 ENCOUNTER — Telehealth: Payer: Self-pay | Admitting: Family Medicine

## 2023-09-27 NOTE — Telephone Encounter (Signed)
Received fax from Cover My Meds for Wegovy 2.4 mg/0.23ml  Key:  [id number]

## 2023-09-29 ENCOUNTER — Telehealth: Payer: Self-pay | Admitting: Family Medicine

## 2023-09-29 NOTE — Telephone Encounter (Signed)
[facility] pharmacy is requesting prescription refill Semaglutide-Weight Management (WEGOVY) 2.4 MG/0.75ML SOAJ  Please advise

## 2023-10-01 MED ORDER — WEGOVY 2.4 MG/0.75ML ~~LOC~~ SOAJ
2.4000 mg | SUBCUTANEOUS | 3 refills | Status: DC
Start: 2023-10-01 — End: 2023-10-02

## 2023-10-02 ENCOUNTER — Telehealth: Payer: Self-pay | Admitting: Family Medicine

## 2023-10-02 MED ORDER — WEGOVY 2.4 MG/0.75ML ~~LOC~~ SOAJ
2.4000 mg | SUBCUTANEOUS | 3 refills | Status: DC
Start: 2023-10-02 — End: 2024-03-01

## 2023-10-02 NOTE — Addendum Note (Signed)
Addended by: Malva Limes on: 10/02/2023 02:41 PM   Modules accepted: Orders

## 2023-10-02 NOTE — Telephone Encounter (Signed)
Covermymeds is requesting prior authorization Key: [id number] [place] form has started but needs to be completed

## 2023-10-03 NOTE — Telephone Encounter (Signed)
Duplicate request. Medication approved

## 2023-10-12 NOTE — Telephone Encounter (Signed)
Copied from CRM [id number]. Topic: General - Other >> Sep 29, 2023  9:52 AM Everette C wrote: Reason for CRM: The patient has been in contact with their pharmacy and been directed to contact their PCP and request completion of a prior authorization for their prescription of Semaglutide-Weight Management (WEGOVY) 2.4 MG/0.75ML SOAJ [[id number]]  Please contact the patient further when possible to follow up

## 2023-12-29 ENCOUNTER — Other Ambulatory Visit: Payer: Self-pay | Admitting: Family Medicine

## 2023-12-29 DIAGNOSIS — I10 Essential (primary) hypertension: Secondary | ICD-10-CM

## 2024-03-01 ENCOUNTER — Encounter: Payer: Self-pay | Admitting: Family Medicine

## 2024-03-01 ENCOUNTER — Ambulatory Visit: Admitting: Family Medicine

## 2024-03-01 VITALS — BP 136/91 | HR 71 | Resp 16 | Ht 64.75 in | Wt 161.2 lb

## 2024-03-01 DIAGNOSIS — J4520 Mild intermittent asthma, uncomplicated: Secondary | ICD-10-CM

## 2024-03-01 DIAGNOSIS — J301 Allergic rhinitis due to pollen: Secondary | ICD-10-CM | POA: Diagnosis not present

## 2024-03-01 DIAGNOSIS — I10 Essential (primary) hypertension: Secondary | ICD-10-CM

## 2024-03-01 DIAGNOSIS — J45909 Unspecified asthma, uncomplicated: Secondary | ICD-10-CM | POA: Insufficient documentation

## 2024-03-01 MED ORDER — ALBUTEROL SULFATE HFA 108 (90 BASE) MCG/ACT IN AERS: 2.0000 | INHALATION_SPRAY | Freq: Four times a day (QID) | RESPIRATORY_TRACT | 2 refills | Status: AC | PRN

## 2024-03-01 MED ORDER — WEGOVY 2.4 MG/0.75ML ~~LOC~~ SOAJ
2.4000 mg | SUBCUTANEOUS | 4 refills | Status: AC
Start: 2024-03-01 — End: ?

## 2024-03-01 MED ORDER — AMLODIPINE BESYLATE 5 MG PO TABS
5.0000 mg | ORAL_TABLET | Freq: Every day | ORAL | 4 refills | Status: AC
Start: 2024-03-01 — End: ?

## 2024-03-01 NOTE — Patient Instructions (Signed)
 Marland Kitchen  Please review the attached list of medications and notify my office if there are any errors.   . Please bring all of your medications to every appointment so we can make sure that our medication list is the same as yours.

## 2024-03-01 NOTE — Progress Notes (Signed)
 Established patient visit   Patient: Katrina Collier   DOB: 1966/09/17   58 y.o. Female  MRN: [id number] Visit Date: 03/01/2024  Today's healthcare provider: Jeralene Mom, MD   Chief Complaint  Patient presents with   Medical Management of Chronic Issues    Follow-up weight management, HTN, seasonal allergies. Need refills.   Subjective    Ambient AI software was used to assist with clinical note transcription.   History of Present Illness   Katrina Collier is a 57 year old female with hypertension  and obesity who presents for a follow-up on her medications.  She was unable to take her blood pressure medication this morning before leaving home. She monitors her blood pressure at home, noting it is usually around 120/91 mmHg. She takes her medication routinely in the morning at work but is inconsistent on weekends. She can usually tell if her blood pressure is elevated.  She is currently using Wegovy  2.4 for weight management, administering it mostly every week. She experiences diarrhea about one day a week as a side effect. She notes no difference in sensitivity whether she takes it weekly or biweekly.  she has been taking Wegovy  since January 2023 when her baseline weight and BMI were 232 pounds and 38 kg/m^2  She also needs a refill for her albuterol  inhaler due to seasonal allergies, experiencing congestion and mild wheezing in her chest. She uses loratadine for allergies, primarily during the allergy season, and has noticed increased symptoms this year due to heavier pollen. She has not had significant allergy issues in the past two years since her last COVID-19 infection.  She reports swelling of her left lateral wrist, diagnosed as tendonitis at an urgent care visit two months ago. 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 She uses a brace when symptoms flare up and was prescribed prednisone. The swelling typically reduces by morning and is not  present at night.     Wt Readings from Last 5 Encounters:  03/01/24 161 lb 3.2 oz (73.1 kg)  06/14/23 157 lb (71.2 kg)  06/08/23 157 lb (71.2 kg)  01/06/23 164 lb 8 oz (74.6 kg)  07/22/22 193 lb (87.5 kg)   BP Readings from Last 3 Encounters:  03/01/24 (!) 136/91  06/14/23 (!) 145/96  06/08/23 (!) 150/82      Medications: Outpatient Medications Prior to Visit  Medication Sig   loratadine (CLARITIN) 10 MG tablet Take 10 mg by mouth daily as needed for allergies (during allergy season).   Misc Natural Products (COLON HERBAL CLEANSER) CAPS Take by mouth.    albuterol  (VENTOLIN  HFA) 108 (90 Base) MCG/ACT inhaler Inhale 2 puffs into the lungs every 6 (six) hours as needed for wheezing or shortness of breath.   amLODipine  (NORVASC ) 5 MG tablet TAKE 1 TABLET(5 MG) BY MOUTH DAILY   Semaglutide -Weight Management (WEGOVY ) 2.4 MG/0.75ML SOAJ Inject 2.4 mg into the skin once a week.   No facility-administered medications prior to visit.   Review of Systems  Constitutional:  Negative for appetite change, chills, fatigue and fever.  Respiratory:  Negative for chest tightness and shortness of breath.   Cardiovascular:  Negative for chest pain and palpitations.  Gastrointestinal:  Negative for abdominal pain, nausea and vomiting.  Neurological:  Negative for dizziness and weakness.       Objective    BP (!) 136/91 (BP Location: Left Arm, Patient Position: Sitting, Cuff Size: Normal)   Pulse 71  Resp 16   Ht 5' 4.75" (1.645 m)   Wt 161 lb 3.2 oz (73.1 kg)   LMP 12/02/2020   SpO2 100%   BMI 27.03 kg/m   Physical Exam   General appearance: Well developed, well nourished female, cooperative and in no acute distress Head: Normocephalic, without obvious abnormality, atraumatic Respiratory: Respirations even and unlabored, normal respiratory rate Extremities: All extremities are intact.  Skin: Skin color, texture, turgor normal. No rashes seen  Psych: Appropriate mood and  affect. Neurologic: Mental status: Alert, oriented to person, place, and time, thought content appropriate.   Assessment & Plan     1. Essential hypertension (Primary) Up today only because she hasn't taken her amlodipine  yet. Home readings are very good.  - amLODipine  (NORVASC ) 5 MG tablet; Take 1 tablet (5 mg total) by mouth daily.  Dispense: 90 tablet; Refill: 4   2. Morbid obesity (HCC) Doing very well with current dose of Wegovy , down from baseline weight of 230 and BMI of 38.5  Refill semaglutide -Weight Management (WEGOVY ) 2.4 MG/0.75ML SOAJ; Inject 2.4 mg into the skin once a week.  Dispense: 9 mL; Refill: 4  3. Mild intermittent reactive airway disease without complication Requires occasional albuterol  inhaler during allergy seasons.   refill albuterol  (VENTOLIN  HFA) 108 (90 Base) MCG/ACT inhaler; Inhale 2 puffs into the lungs every 6 (six) hours as needed for wheezing or shortness of breath.  Dispense: 8 g; Refill: 2  4. Seasonal allergic rhinitis due to pollen Continue OTC loratadine     Return for Yearly Physical.      Jeralene Mom, MD  [facility] [phone] (phone) [phone] (fax)  [facility]

## 2024-04-26 ENCOUNTER — Encounter: Admitting: Family Medicine

## 2024-06-03 ENCOUNTER — Encounter: Payer: Self-pay | Admitting: Family Medicine

## 2024-06-03 ENCOUNTER — Other Ambulatory Visit (HOSPITAL_COMMUNITY)
Admission: RE | Admit: 2024-06-03 | Discharge: 2024-06-03 | Disposition: A | Discharge status: Home / Self Care | Source: Ambulatory Visit | Attending: Family Medicine | Admitting: Family Medicine

## 2024-06-03 ENCOUNTER — Ambulatory Visit (INDEPENDENT_AMBULATORY_CARE_PROVIDER_SITE_OTHER): Admitting: Family Medicine

## 2024-06-03 VITALS — BP 137/83 | HR 58 | Temp 98.4°F | Ht 64.0 in | Wt 165.0 lb

## 2024-06-03 DIAGNOSIS — R32 Unspecified urinary incontinence: Secondary | ICD-10-CM

## 2024-06-03 DIAGNOSIS — H43392 Other vitreous opacities, left eye: Secondary | ICD-10-CM

## 2024-06-03 DIAGNOSIS — Z0000 Encounter for general adult medical examination without abnormal findings: Secondary | ICD-10-CM

## 2024-06-03 DIAGNOSIS — Z1231 Encounter for screening mammogram for malignant neoplasm of breast: Secondary | ICD-10-CM

## 2024-06-03 DIAGNOSIS — Z124 Encounter for screening for malignant neoplasm of cervix: Secondary | ICD-10-CM | POA: Diagnosis present

## 2024-06-03 DIAGNOSIS — J301 Allergic rhinitis due to pollen: Secondary | ICD-10-CM | POA: Diagnosis not present

## 2024-06-03 NOTE — Patient Instructions (Signed)
 Please call the [facility] [phone]) to schedule a routine screening mammogram.

## 2024-06-03 NOTE — Progress Notes (Signed)
 Complete physical exam   Patient: Katrina Collier   DOB: Jun 30, 1966   58 y.o. Female  MRN: [id number] Visit Date: 06/03/2024  Today's healthcare provider: LAURAINE LOISE BUOY, DO   Chief Complaint  Patient presents with   Annual Exam    Eating healthy, exercises, feels well.  Does not sleep well.  Unable to stay asleep.   Subjective    Katrina Collier is a 58 y.o. female who presents today for a complete physical exam.  She reports consuming a general diet. She exercises by taking the stairs at work (3 flights twice) and has been walking (following along with a tape). She generally feels well. She reports sleeping poorly but trouble staying asleep. She does not have additional problems to discuss today.   HPI HPI     Annual Exam    Additional comments: Eating healthy, exercises, feels well.  Does not sleep well.  Unable to stay asleep.      Last edited by Desanto, Elena D, CMA on 06/03/2024  2:59 PM.      Katrina Collier is a 58 year old female who presents for a full physical exam and Pap smear.  She has experienced difficulty with sleep over the past year, characterized by multiple nocturnal awakenings. She can fall asleep quickly but often wakes up during the night. She attributes this to a previous habit of nighttime eating, which she has since discontinued. She attempts to roll over and return to sleep, which is sometimes effective, but occasionally results in prolonged periods of tossing and turning.  She engages in physical activity by taking the stairs at work, climbing three flights up and down to the basement during her breaks. She has also started a walking tape routine with her sister. 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  She has experienced floaters in her vision, which led to an emergency eye visit due to concerns about retinal detachment. The floaters were described as  'big floaters and waving all over,' covering her face. She had a recheck last week and reports that her floaters are still very annoying.  She had a bacterial sinus infection about a month ago, which has since improved. She experiences a runny nose due to allergies and has started taking loratadine for relief.  She wears bladder control pads due to having had five children, which she believes may contribute to an odor. She practices Kegel exercises regularly and has tried pelvic floor weights in the past.  She has not had sexual intercourse in over ten years due to her husband's prostate cancer. No chest pain, shortness of breath, or double vision, but she does have floaters in her vision.  She received the initial COVID-19 vaccine in 2020 but does not plan to get further boosters. She has not received the pneumonia vaccine and is unsure about the shingles vaccine coverage by her insurance. She believes she completed the hepatitis B series due to her healthcare background.     Past Medical History:  Diagnosis Date   Hypertension    Past Surgical History:  Procedure Laterality Date   BREAST REDUCTION SURGERY     CARPAL TUNNEL RELEASE     LAPAROSCOPIC GASTRIC SLEEVE RESECTION WITH HIATAL HERNIA REPAIR  06/2017   [facility]   TUBAL LIGATION     WISDOM TOOTH EXTRACTION     Social History   Socioeconomic History  Marital status: Married    Spouse name: Not on file   Number of children: Not on file   Years of education: Not on file   Highest education level: Not on file  Occupational History   Not on file  Tobacco Use   Smoking status: Never   Smokeless tobacco: Never  Vaping Use   Vaping status: Never Used  Substance and Sexual Activity   Alcohol use: Yes    Alcohol/week: 0.0 standard drinks of alcohol    Comment: rarely   Drug use: No   Sexual activity: Yes    Partners: Male    Birth control/protection: Surgical  Other Topics Concern   Not on file  Social  History Narrative   Not on file   Social Drivers of Health   Financial Resource Strain: Low Risk  (12/27/2023)   Received from [facility]   Overall Financial Resource Strain (CARDIA)    Difficulty of Paying Living Expenses: Not hard at all  Food Insecurity: No Food Insecurity (12/27/2023)   Received from [facility]   Hunger Vital Sign    Within the past 12 months, you worried that your food would run out before you got the money to buy more.: Never true    Within the past 12 months, the food you bought just didn't last and you didn't have money to get more.: Never true  Transportation Needs: No Transportation Needs (12/27/2023)   Received from [facility] - Transportation    In the past 12 months, has lack of transportation kept you from medical appointments or from getting medications?: No    Lack of Transportation (Non-Medical): No  Physical Activity: Not on file  Stress: Not on file  Social Connections: Not on file  Intimate Partner Violence: Not on file   Family Status  Relation Name Status   Mother  Alive   Father  Alive   Sister  (Not Specified)   MGM  (Not Specified)   PGM  (Not Specified)  No partnership data on file   Family History  Problem Relation Age of Onset   Cancer Mother        MELANOMA   Hypertension Father    Hypertension Sister    Heart disease Maternal Grandmother    Heart disease Paternal Grandmother    Allergies  Allergen Reactions   Venlafaxine  Other (See Comments)    Aggravates restless legs.     Patient Care Team: Gasper Nancyann BRAVO, MD as PCP - General (Family Medicine) Neysa Inocente PARAS, NP (Inactive) as Nurse Practitioner (Obstetrics and Gynecology)   Medications: Outpatient Medications Prior to Visit  Medication Sig   albuterol  (VENTOLIN  HFA) 108 (90 Base) MCG/ACT inhaler Inhale 2 puffs into the lungs every 6 (six) hours as needed for wheezing or shortness of breath.    amLODipine  (NORVASC ) 5 MG tablet Take 1 tablet (5 mg total) by mouth daily.   loratadine (CLARITIN) 10 MG tablet Take 10 mg by mouth daily as needed for allergies (during allergy season).   Misc Natural Products (COLON HERBAL CLEANSER) CAPS Take by mouth.   Semaglutide -Weight Management (WEGOVY ) 2.4 MG/0.75ML SOAJ Inject 2.4 mg into the skin once a week.   No facility-administered medications prior to visit.    Review of Systems  Constitutional:  Negative for chills, fatigue and fever.  HENT:  Negative for congestion, ear pain, rhinorrhea, sneezing and sore throat.   Eyes: Negative.  Negative for pain and  redness.  Respiratory:  Negative for cough, shortness of breath and wheezing.   Cardiovascular:  Negative for chest pain and leg swelling.  Gastrointestinal:  Negative for abdominal pain, blood in stool, constipation, diarrhea and nausea.  Endocrine: Negative for polydipsia and polyphagia.  Genitourinary: Negative.  Negative for dysuria, flank pain, hematuria, pelvic pain, vaginal bleeding and vaginal discharge.  Musculoskeletal:  Negative for arthralgias, back pain, gait problem and joint swelling.  Skin:  Negative for rash.  Neurological: Negative.  Negative for dizziness, tremors, seizures, weakness, light-headedness, numbness and headaches.  Hematological:  Negative for adenopathy.  Psychiatric/Behavioral: Negative.  Negative for behavioral problems, confusion and dysphoric mood. The patient is not nervous/anxious and is not hyperactive.       Objective    BP 137/83 (BP Location: Left Arm, Patient Position: Sitting, Cuff Size: Normal)   Pulse (!) 58   Temp 98.4 F (36.9 C) (Oral)   Ht 5' 4 (1.626 m)   Wt 165 lb (74.8 kg)   LMP 12/02/2020   SpO2 100%   BMI 28.32 kg/m    Physical Exam Vitals and nursing note reviewed.  Constitutional:      General: She is awake.     Appearance: Normal appearance.  HENT:     Head: Normocephalic and atraumatic.     Right Ear: Tympanic  membrane, ear canal and external ear normal.     Left Ear: Tympanic membrane, ear canal and external ear normal.     Nose: Nose normal.     Mouth/Throat:     Mouth: Mucous membranes are moist.     Pharynx: Oropharynx is clear. No oropharyngeal exudate or posterior oropharyngeal erythema.  Eyes:     General: No scleral icterus.    Extraocular Movements: Extraocular movements intact.     Conjunctiva/sclera: Conjunctivae normal.     Pupils: Pupils are equal, round, and reactive to light.  Neck:     Thyroid : No thyromegaly or thyroid  tenderness.  Cardiovascular:     Rate and Rhythm: Normal rate and regular rhythm.     Pulses: Normal pulses.     Heart sounds: Normal heart sounds.  Pulmonary:     Effort: Pulmonary effort is normal. No tachypnea, bradypnea or respiratory distress.     Breath sounds: Normal breath sounds. No stridor. No wheezing, rhonchi or rales.  Abdominal:     General: Bowel sounds are normal. There is no distension.     Palpations: Abdomen is soft. There is no mass.     Tenderness: There is no abdominal tenderness. There is no guarding.     Hernia: No hernia is present.  Genitourinary:    General: Normal vulva.     Exam position: Lithotomy position.     Pubic Area: No rash.      Labia:        Right: No rash, tenderness, lesion or injury.        Left: No rash, tenderness, lesion or injury.      Urethra: Prolapse present. No urethral pain, urethral swelling or urethral lesion.     Vagina: Normal.     Cervix: Normal.     Uterus: Normal.      Adnexa: Right adnexa normal and left adnexa normal.  Musculoskeletal:     Cervical back: Normal range of motion and neck supple.     Right lower leg: No edema.     Left lower leg: No edema.  Lymphadenopathy:     Cervical: No cervical adenopathy.  Skin:  General: Skin is warm and dry.  Neurological:     Mental Status: She is alert and oriented to person, place, and time. Mental status is at baseline.  Psychiatric:         Mood and Affect: Mood normal.        Behavior: Behavior normal.      Last depression screening scores    06/03/2024    2:57 PM 03/01/2024    9:13 AM 06/14/2023    9:31 AM  PHQ 2/9 Scores  PHQ - 2 Score 0 0 1  PHQ- 9 Score 2  7   Last fall risk screening    06/03/2024    2:57 PM  Fall Risk   Falls in the past year? 1  Number falls in past yr: 0  Injury with Fall? 0   Last Audit-C alcohol use screening    06/14/2023    9:32 AM  Alcohol Use Disorder Test (AUDIT)  1. How often do you have a drink containing alcohol? 1  2. How many drinks containing alcohol do you have on a typical day when you are drinking? 0  3. How often do you have six or more drinks on one occasion? 0  AUDIT-C Score 1   A score of 3 or more in women, and 4 or more in men indicates increased risk for alcohol abuse, EXCEPT if all of the points are from question 1   No results found for any visits on 06/03/24.  Assessment & Plan    Routine Health Maintenance and Physical Exam  Exercise Activities and Dietary recommendations  Goals   None     Immunization History  Administered Date(s) Administered   Influenza,inj,Quad PF,6+ Mos 08/12/2014, 07/29/2015, 08/23/2018, 07/09/2019, 08/07/2020, 08/27/2021, 07/22/2022   MMR 08/12/2019   PFIZER(Purple Top)SARS-COV-2 Vaccination 03/24/2020, 04/14/2020   Rabies, IM 07/25/2012   Rabies, intradermal 07/25/2012   Tdap 07/09/2019    Health Maintenance  Topic Date Due   Cervical Cancer Screening (HPV/Pap Cotest)  01/31/2016   MAMMOGRAM  04/20/2017   Zoster Vaccines- Shingrix (1 of 2) Never done   COVID-19 Vaccine (3 - 2024-25 season) 08/07/2024 (Originally 07/09/2023)   Pneumococcal Vaccine 74-17 Years old (1 of 2 - PCV) 06/03/2025 (Originally 04/20/1986)   INFLUENZA VACCINE  06/07/2024   Fecal DNA (Cologuard)  09/04/2025   DTaP/Tdap/Td (2 - Td or Tdap) 07/08/2029   Hepatitis C Screening  Completed   HIV Screening  Completed   HPV VACCINES  Aged Out    Meningococcal B Vaccine  Aged Out   Hepatitis B Vaccines  Discontinued    Discussed health benefits of physical activity, and encouraged her to engage in regular exercise appropriate for her age and condition.   Annual physical exam  Cervical cancer screening -     Cytology - PAP  Encounter for screening mammogram for malignant neoplasm of breast -     3D Screening Mammogram, Left and Right; Future  Floaters in visual field, left  Urinary incontinence, unspecified type  Seasonal allergic rhinitis due to pollen      Annual physical exam; cervical cancer screening Physical exam overall unremarkable except as noted above. Vaccinations and screenings discussed. Declined COVID booster and pneumonia vaccine. Open to shingles vaccine pending coverage. Completed hepatitis B series. Mammogram ordered and Pap smear performed. - Order mammogram. - Perform Pap smear. - Discuss shingles vaccine coverage with insurance.  Floaters; concern for retinal detachment risk Large floaters with recent emergency visit. No detachment on follow-up. Requires  monitoring. - Continue annual eye check-ups.  Urinary Incontinence Incontinence likely related to childbirth history. Uses pads and performs Kegel exercises. - Consider pelvic floor physical therapy. - Discuss pelvic strengthening with weights.  Seasonal allergic rhinitis due to pollen Chronic, stable allergic rhinitis managed with loratadine. - Continue loratadine.   Follow-up Follow-up on mammogram and shingles vaccine coverage needed. - Follow up on mammogram appointment. - Check insurance coverage for shingles vaccine.  Return in about 6 months (around 12/04/2024) for Chronic f/u w/PCP.     I discussed the assessment and treatment plan with the patient  The patient was provided an opportunity to ask questions and all were answered. The patient agreed with the plan and demonstrated an understanding of the instructions.   The patient  was advised to call back or seek an in-person evaluation if the symptoms worsen or if the condition fails to improve as anticipated.    LAURAINE LOISE BUOY, DO  [facility] [phone] (phone) [phone] (fax)  [facility]

## 2024-06-11 LAB — CYTOLOGY - PAP
Comment: NEGATIVE
Diagnosis: NEGATIVE
High risk HPV: NEGATIVE

## 2024-06-13 ENCOUNTER — Ambulatory Visit: Payer: Self-pay | Admitting: Family Medicine
